# Patient Record
Sex: Female | Born: 1970
Health system: Southern US, Community
[De-identification: ages and names within clinical notes are randomized; demographics above are authoritative.]

## PROBLEM LIST (undated history)

## (undated) DIAGNOSIS — R42 Dizziness and giddiness: Secondary | ICD-10-CM

## (undated) DIAGNOSIS — D649 Anemia, unspecified: Secondary | ICD-10-CM

## (undated) HISTORY — PX: DILATION AND CURETTAGE OF UTERUS: SHX78

## (undated) HISTORY — DX: Anemia, unspecified: D64.9

---

## 2013-02-03 ENCOUNTER — Encounter (HOSPITAL_COMMUNITY): Payer: Self-pay

## 2013-02-03 ENCOUNTER — Emergency Department (HOSPITAL_COMMUNITY)
Admission: EM | Admit: 2013-02-03 | Discharge: 2013-02-04 | Disposition: A | Discharge status: Home / Self Care | Payer: Medicaid Other | Attending: Emergency Medicine | Admitting: Emergency Medicine

## 2013-02-03 ENCOUNTER — Emergency Department (HOSPITAL_COMMUNITY): Payer: Medicaid Other

## 2013-02-03 DIAGNOSIS — H53149 Visual discomfort, unspecified: Secondary | ICD-10-CM | POA: Insufficient documentation

## 2013-02-03 DIAGNOSIS — R42 Dizziness and giddiness: Secondary | ICD-10-CM | POA: Insufficient documentation

## 2013-02-03 DIAGNOSIS — G43909 Migraine, unspecified, not intractable, without status migrainosus: Secondary | ICD-10-CM | POA: Insufficient documentation

## 2013-02-03 DIAGNOSIS — R110 Nausea: Secondary | ICD-10-CM | POA: Insufficient documentation

## 2013-02-03 HISTORY — DX: Dizziness and giddiness: R42

## 2013-02-03 LAB — CBC
HCT: 37.4 % (ref 36.0–46.0)
Hemoglobin: 12.5 g/dL (ref 12.0–15.0)
MCH: 28.2 pg (ref 26.0–34.0)
MCHC: 33.4 g/dL (ref 30.0–36.0)
MCV: 84.4 fL (ref 78.0–100.0)
Platelets: 269 10*3/uL (ref 150–400)
RBC: 4.43 MIL/uL (ref 3.87–5.11)
RDW: 14 % (ref 11.5–15.5)
WBC: 5.9 10*3/uL (ref 4.0–10.5)

## 2013-02-03 LAB — CBC WITH DIFFERENTIAL/PLATELET
Basophils Absolute: 0 10*3/uL (ref 0.0–0.1)
Basophils Relative: 0 % (ref 0–1)
Eosinophils Absolute: 0.1 10*3/uL (ref 0.0–0.7)
Eosinophils Relative: 2 % (ref 0–5)
HCT: 35.3 % — ABNORMAL LOW (ref 36.0–46.0)
Hemoglobin: 11.8 g/dL — ABNORMAL LOW (ref 12.0–15.0)
Lymphocytes Relative: 39 % (ref 12–46)
Lymphs Abs: 2.2 10*3/uL (ref 0.7–4.0)
MCH: 28.2 pg (ref 26.0–34.0)
MCHC: 33.4 g/dL (ref 30.0–36.0)
MCV: 84.2 fL (ref 78.0–100.0)
Monocytes Absolute: 0.7 10*3/uL (ref 0.1–1.0)
Monocytes Relative: 12 % (ref 3–12)
Neutro Abs: 2.5 10*3/uL (ref 1.7–7.7)
Neutrophils Relative %: 46 % (ref 43–77)
Platelets: 257 10*3/uL (ref 150–400)
RBC: 4.19 MIL/uL (ref 3.87–5.11)
RDW: 14 % (ref 11.5–15.5)
WBC: 5.5 10*3/uL (ref 4.0–10.5)

## 2013-02-03 LAB — BASIC METABOLIC PANEL
BUN: 10 mg/dL (ref 6–23)
CO2: 29 mEq/L (ref 19–32)
Calcium: 9.2 mg/dL (ref 8.4–10.5)
Chloride: 104 mEq/L (ref 96–112)
Creatinine, Ser: 0.75 mg/dL (ref 0.50–1.10)
GFR calc Af Amer: >90 mL/min (ref >90)
GFR calc non Af Amer: >90 mL/min (ref >90)
Glucose, Bld: 77 mg/dL (ref 70–99)
Potassium: 3.8 mEq/L (ref 3.5–5.1)
Sodium: 139 mEq/L (ref 135–145)

## 2013-02-03 LAB — POCT I-STAT TROPONIN I: Troponin i, poc: 0 ng/mL (ref 0.00–0.08)

## 2013-02-03 LAB — COMPREHENSIVE METABOLIC PANEL
ALT: 15 U/L (ref 0–35)
AST: 19 U/L (ref 0–37)
Albumin: 3.6 g/dL (ref 3.5–5.2)
Alkaline Phosphatase: 63 U/L (ref 39–117)
BUN: 8 mg/dL (ref 6–23)
CO2: 31 mEq/L (ref 19–32)
Calcium: 9.5 mg/dL (ref 8.4–10.5)
Chloride: 101 mEq/L (ref 96–112)
Creatinine, Ser: 0.6 mg/dL (ref 0.50–1.10)
GFR calc Af Amer: >90 mL/min (ref >90)
GFR calc non Af Amer: >90 mL/min (ref >90)
Glucose, Bld: 80 mg/dL (ref 70–99)
Potassium: 3.4 mEq/L — ABNORMAL LOW (ref 3.5–5.1)
Sodium: 139 mEq/L (ref 135–145)
Total Bilirubin: 0.3 mg/dL (ref 0.3–1.2)
Total Protein: 7.1 g/dL (ref 6.0–8.3)

## 2013-02-03 MED ORDER — DIPHENHYDRAMINE HCL 50 MG/ML IJ SOLN
25.0000 mg | Freq: Once | INTRAMUSCULAR | Status: AC
  Administered 2013-02-03: 25 mg via INTRAVENOUS
  Filled 2013-02-03: qty 1

## 2013-02-03 MED ORDER — PROCHLORPERAZINE EDISYLATE 5 MG/ML IJ SOLN
10.0000 mg | Freq: Once | INTRAMUSCULAR | Status: AC
  Administered 2013-02-03: 10 mg via INTRAVENOUS
  Filled 2013-02-03: qty 2

## 2013-02-03 MED ORDER — SODIUM CHLORIDE 0.9 % IV BOLUS (SEPSIS)
1000.0000 mL | Freq: Once | INTRAVENOUS | Status: AC
  Administered 2013-02-03: 1000 mL via INTRAVENOUS

## 2013-02-03 MED ORDER — DEXAMETHASONE SODIUM PHOSPHATE 10 MG/ML IJ SOLN
10.0000 mg | Freq: Once | INTRAMUSCULAR | Status: AC
  Administered 2013-02-03: 10 mg via INTRAVENOUS
  Filled 2013-02-03: qty 1

## 2013-02-03 NOTE — ED Notes (Signed)
Pt back from MRI 

## 2013-02-03 NOTE — ED Notes (Addendum)
Pt. Has had dizziness for 17 years and in the last few months the dizziness is worse and she also has been feeling weak in the last few months.  Pt. Is alert and oriented X4.  Speech is clear/  She denies any n/v/d.  Pt. Is treated for dizziness. No neuro deficits noted. Pt. Denies any chest pain

## 2013-02-03 NOTE — ED Notes (Signed)
Family at bedside. 

## 2013-02-04 NOTE — ED Provider Notes (Signed)
History     CSN: [id number]  Arrival date & time 02/03/13  1843   First MD Initiated Contact with Patient 02/03/13 2002      Chief Complaint  Patient presents with  . Dizziness    HPI 42 year old female with a history of headaches and vertigo comes in complaining of severe headache and dizziness.  Approximately 3 days ago patient had a mild headache that has gradually worsened over the course the past 3 days and is now severe, 10 out of 10. The pain isn't constant. She has had associated dizziness. She describes the dizziness as a sensation of the ribs pending. Her headache is located frontally and around her eyes and is associated with photophobia, phonophobia, nausea, and she said 2 episodes of nonbloody nonbilious emesis. She's had no fever, no neck pain, no chest pain, shortness of breath, no diarrhea, no abdominal pain, no dysuria, no urinary pregnancy, or other significant symptoms.  She reports that she has had similar headaches many times in the past although she's never been diagnosed with migraines. She normally takes acetaminophen for headaches. Her headaches are sometimes received relieved by acetaminophen and other times not. She states that she has been diagnosed with "dizziness" before. She states that her dizziness was similar in the past to which is experiencing now but not as severe. This presently she experienced many times over the years. She's had it associated with her headaches on occasion. She states that her primary care physician has given her a medicine for her dizziness before. Can't recall the medicine is. She states that her dizziness is made worse by laying flat. It is relieved by nothing. It is severe. It makes her nausea worse. She's not had any difficulty with walking or with balance.   Past Medical History  Diagnosis Date  . Vertigo     History reviewed. No pertinent past surgical history.  No family history on file.  History  Substance Use Topics  .  Smoking status: Never Smoker   . Smokeless tobacco: Not on file  . Alcohol Use: No    OB History   Grav Para Term Preterm Abortions TAB SAB Ect Mult Living                  Review of Systems  Constitutional: Negative for fever, chills and diaphoresis.  HENT: Negative for congestion, rhinorrhea, neck pain and neck stiffness.   Respiratory: Negative for cough, shortness of breath and wheezing.   Cardiovascular: Negative for chest pain and leg swelling.  Gastrointestinal: Negative for nausea, vomiting, abdominal pain and diarrhea.  Genitourinary: Negative for dysuria, urgency, frequency, flank pain, vaginal bleeding, vaginal discharge and difficulty urinating.  Skin: Negative for rash.  Neurological: Positive for dizziness and headaches. Negative for tremors, seizures, syncope, facial asymmetry, speech difficulty, weakness and numbness.  All other systems reviewed and are negative.    Allergies  Review of patient's allergies indicates no known allergies.  Home Medications   Current Outpatient Rx  Name  Route  Sig  Dispense  Refill  . OVER THE COUNTER MEDICATION   Oral   Take 2 tablets by mouth once. Headache medicine           BP 105/76  Pulse 68  Temp(Src) 98.2 F (36.8 C) (Oral)  Resp 16  SpO2 100%  LMP 01/06/2013  Physical Exam  Nursing note and vitals reviewed. Constitutional: She is oriented to person, place, and time. She appears well-developed and well-nourished. No distress.  HENT:  Head: Normocephalic and atraumatic.  Mouth/Throat: Oropharynx is clear and moist.  Eyes: Conjunctivae and EOM are normal. Pupils are equal, round, and reactive to light. No scleral icterus.  Neck: Normal range of motion. Neck supple. No JVD present.  Cardiovascular: Normal rate, regular rhythm, normal heart sounds and intact distal pulses.  Exam reveals no gallop and no friction rub.   No murmur heard. Pulmonary/Chest: Effort normal and breath sounds normal. No respiratory  distress. She has no wheezes. She has no rales.  Abdominal: Soft. Bowel sounds are normal. She exhibits no distension. There is no tenderness. There is no rebound and no guarding.  Musculoskeletal: She exhibits no edema.  Neurological: She is alert and oriented to person, place, and time. She displays normal reflexes. No cranial nerve deficit. She exhibits normal muscle tone. Coordination normal. GCS eye subscore is 4. GCS verbal subscore is 5. GCS motor subscore is 6.  5/5 strength in bilateral upper and lower extremities.  Normal sensation to light touch throughout.  Visual fields intact to confrontation.  Normal heel to shin and finger to nose testing.  Normal gait.  Negative Romberg.  Skin: Skin is warm and dry. She is not diaphoretic.    ED Course  Procedures (including critical care time)  Labs Reviewed  COMPREHENSIVE METABOLIC PANEL - Abnormal; Notable for the following:    Potassium 3.4 (*)    All other components within normal limits  CBC WITH DIFFERENTIAL - Abnormal; Notable for the following:    Hemoglobin 11.8 (*)    HCT 35.3 (*)    All other components within normal limits  CBC  BASIC METABOLIC PANEL  POCT I-STAT TROPONIN I   No results found.   1. Migraine   2. Dizziness       MDM  42 year old female with a history of vertigo and headaches presents complaining of the same. Sig on for about 3 days. Her headache is at 10 but has been gradual in onset over the course of the last 3 days has slowly worsened. Her vertigo is similar to prior symptoms.  She has vitals that are within normal limits. Head is atraumatic she has supple neck with no meningismus, negative Kernig, negative Brudzinski. Her neurological exam is nonfocal. This includes normal gait and normal finger-nose-finger, normal heel to shin, visual fields intact to confrontation. She has no carotid bruit, exam is otherwise unremarkable.  Her headache is gradual in onset and seems migrainous in nature. However  due to the persistence of her vertigo obtained a MRI of her brain which is negative for any acute stroke, mass lesions, or any other acute abnormality. She is treating her support with a migraine cocktail and her symptoms completely resolved. Feels she is safe for discharge with follow up with her primary care physician this week to discuss her migraines. She is given return precautions. An Arabic interpreter was used for history and discharge instructions.        Toney Sang, MD 02/04/13 [phone]

## 2013-02-07 NOTE — ED Provider Notes (Signed)
I reviewed the resident's note and I agree with the findings and plan.   Celene Kras, MD 02/07/13 [phone]

## 2013-03-10 ENCOUNTER — Other Ambulatory Visit: Payer: Self-pay | Admitting: Family Medicine

## 2013-03-10 DIAGNOSIS — M549 Dorsalgia, unspecified: Secondary | ICD-10-CM

## 2013-03-10 DIAGNOSIS — R319 Hematuria, unspecified: Secondary | ICD-10-CM

## 2013-03-11 ENCOUNTER — Ambulatory Visit
Admission: RE | Admit: 2013-03-11 | Discharge: 2013-03-11 | Disposition: A | Discharge status: Home / Self Care | Payer: Medicaid Other | Source: Ambulatory Visit | Attending: Family Medicine | Admitting: Family Medicine

## 2013-03-11 DIAGNOSIS — M549 Dorsalgia, unspecified: Secondary | ICD-10-CM

## 2013-03-11 DIAGNOSIS — R319 Hematuria, unspecified: Secondary | ICD-10-CM

## 2013-03-22 ENCOUNTER — Ambulatory Visit: Payer: Medicaid Other | Admitting: Physical Therapy

## 2013-03-22 ENCOUNTER — Ambulatory Visit: Payer: Medicaid Other | Attending: Orthopedic Surgery | Admitting: Physical Therapy

## 2013-03-22 DIAGNOSIS — IMO0001 Reserved for inherently not codable concepts without codable children: Secondary | ICD-10-CM | POA: Insufficient documentation

## 2013-03-22 DIAGNOSIS — M545 Low back pain, unspecified: Secondary | ICD-10-CM | POA: Insufficient documentation

## 2013-09-05 ENCOUNTER — Encounter (INDEPENDENT_AMBULATORY_CARE_PROVIDER_SITE_OTHER): Payer: Self-pay

## 2013-09-05 ENCOUNTER — Emergency Department (INDEPENDENT_AMBULATORY_CARE_PROVIDER_SITE_OTHER)
Admission: EM | Admit: 2013-09-05 | Discharge: 2013-09-05 | Disposition: A | Discharge status: Home / Self Care | Payer: Medicaid Other | Source: Home / Self Care | Attending: Emergency Medicine | Admitting: Emergency Medicine

## 2013-09-05 ENCOUNTER — Encounter (HOSPITAL_COMMUNITY): Payer: Self-pay | Admitting: Emergency Medicine

## 2013-09-05 ENCOUNTER — Encounter (INDEPENDENT_AMBULATORY_CARE_PROVIDER_SITE_OTHER): Payer: Self-pay | Admitting: Surgery

## 2013-09-05 ENCOUNTER — Ambulatory Visit (INDEPENDENT_AMBULATORY_CARE_PROVIDER_SITE_OTHER): Payer: Medicaid Other | Admitting: Surgery

## 2013-09-05 VITALS — BP 124/60 | HR 88 | Temp 98.4°F | Resp 14 | Ht 61.0 in | Wt 163.0 lb

## 2013-09-05 DIAGNOSIS — D179 Benign lipomatous neoplasm, unspecified: Secondary | ICD-10-CM

## 2013-09-05 DIAGNOSIS — A088 Other specified intestinal infections: Secondary | ICD-10-CM

## 2013-09-05 DIAGNOSIS — A084 Viral intestinal infection, unspecified: Secondary | ICD-10-CM

## 2013-09-05 LAB — POCT I-STAT, CHEM 8
BUN: 7 mg/dL (ref 6–23)
Calcium, Ion: 1.24 mmol/L — ABNORMAL HIGH (ref 1.12–1.23)
Chloride: 100 mEq/L (ref 96–112)
Creatinine, Ser: 0.6 mg/dL (ref 0.50–1.10)
Glucose, Bld: 84 mg/dL (ref 70–99)
HCT: 46 % (ref 36.0–46.0)
Hemoglobin: 15.6 g/dL — ABNORMAL HIGH (ref 12.0–15.0)
Potassium: 3.8 mEq/L (ref 3.5–5.1)
Sodium: 139 mEq/L (ref 135–145)
TCO2: 26 mmol/L (ref 0–100)

## 2013-09-05 MED ORDER — ONDANSETRON HCL 4 MG/2ML IJ SOLN
INTRAMUSCULAR | Status: AC
Start: 2013-09-05 — End: 2013-09-05
  Filled 2013-09-05: qty 2

## 2013-09-05 MED ORDER — DIPHENOXYLATE-ATROPINE 2.5-0.025 MG PO TABS: 1.0000 | ORAL_TABLET | Freq: Four times a day (QID) | ORAL | Status: DC | PRN

## 2013-09-05 MED ORDER — ONDANSETRON HCL 4 MG/2ML IJ SOLN
4.0000 mg | Freq: Once | INTRAMUSCULAR | Status: AC
  Administered 2013-09-05: 4 mg via INTRAVENOUS

## 2013-09-05 MED ORDER — ONDANSETRON HCL 8 MG PO TABS: 8.0000 mg | ORAL_TABLET | Freq: Three times a day (TID) | ORAL | Status: DC | PRN

## 2013-09-05 MED ORDER — SODIUM CHLORIDE 0.9 % IV SOLN
INTRAVENOUS | Status: DC
  Administered 2013-09-05: 15:00:00 via INTRAVENOUS

## 2013-09-05 NOTE — ED Notes (Signed)
Pt  Reports   Symptoms  Of  Nausea   Vomiting  Diarrhea         With  dizzyness     X  4  Days          Pt  Last  Vomited  Yesterday        History  Of  Vertigo     Pt  Also  Reports  Symptoms      decresed  Appetite

## 2013-09-05 NOTE — Discharge Instructions (Signed)

## 2013-09-05 NOTE — ED Provider Notes (Signed)
Chief Complaint:   Chief Complaint  Patient presents with  . Nausea    History of Present Illness:   Carrie Shelton is a 42 year old female who presents with a four-day history of nausea, vomiting, diarrhea, dizziness, and inability to eat any solid foods. She has not had any fever or chills. There's been no blood in the vomitus or the stool the diarrhea has gotten better, but she still vomiting up almost all by mouth intake. No recent foreign travel, sick exposures, or suspicious ingestions. She denies any URI symptoms. She speaks limited [place]. She is with her daughter social work Tax inspector. They served as Nurse, learning disability for her.  Review of Systems:  Other than noted above, the patient denies any of the following symptoms: Systemic:  No fevers, chills, sweats, weight loss or gain, fatigue, or tiredness. ENT:  No nasal congestion, rhinorrhea, or sore throat. Lungs:  No cough, wheezing, or shortness of breath. Cardiac:  No chest pain, syncope, or presyncope. GI:  No abdominal pain, nausea, vomiting, anorexia, diarrhea, constipation, blood in stool or vomitus. GU:  No dysuria, frequency, or urgency.  PMFSH:  Past medical history, family history, social history, meds, and allergies were reviewed. She has a long-standing history of weakness and vertigo. She's been to the emergency room for the vertigo. She has seen her primary care physician for this as well. She's on Antivert.  Physical Exam:   Vital signs:  BP 100/70  Pulse 96  Temp(Src) 97.9 F (36.6 C) (Oral)  Resp 16  SpO2 100%  LMP 08/24/2013 Filed Vitals:   09/05/13 1357 Baseline  09/05/13 1446 Supine  09/05/13 1448 Sitting  09/05/13 1450 Standing   BP: 95/71 98/68 104/60 100/70  Pulse: 78 75 74 96  Temp: 97.9 F (36.6 C)     TempSrc: Oral     Resp: 16     SpO2: 100%      General:  Alert and oriented.  In no distress.  Skin warm and dry.  Good skin turgor, brisk capillary refill. ENT:  No scleral icterus, moist mucous  membranes, no oral lesions, pharynx clear. Lungs:  Breath sounds clear and equal bilaterally.  No wheezes, rales, or rhonchi. Heart:  Rhythm regular, without extrasystoles.  No gallops or murmers. Abdomen:  Soft, flat, nondistended. No organomegaly or mass. Bowel sounds are normally active. No tenderness, guarding, or rebound. Skin: Clear, warm, and dry.  Good turgor.  Brisk capillary refill.  Labs:   Results for orders placed during the hospital encounter of 09/05/13  POCT I-STAT, CHEM 8      Result Value Range   Sodium 139  135 - 145 mEq/L   Potassium 3.8  3.5 - 5.1 mEq/L   Chloride 100  96 - 112 mEq/L   BUN 7  6 - 23 mg/dL   Creatinine, Ser 9.56  0.50 - 1.10 mg/dL   Glucose, Bld 84  70 - 99 mg/dL   Calcium, Ion 2.13 (*) 1.12 - 1.23 mmol/L   TCO2 26  0 - 100 mmol/L   Hemoglobin 15.6 (*) 12.0 - 15.0 g/dL   HCT 08.6  57.8 - 46.9 %     Course in Urgent Care Center:   She was given 1 L of normal saline over one hour and Zofran 4 mg IV. She felt better thereafter and did not have any further nausea or diarrhea while at the Urgent Care Center.  Assessment:  The encounter diagnosis was Viral gastroenteritis.  With mild dehydration.  Plan:  1.  Meds:  The following meds were prescribed:   Discharge Medication List as of 09/05/2013  3:59 PM    START taking these medications   Details  diphenoxylate-atropine (LOMOTIL) 2.5-0.025 MG per tablet Take 1 tablet by mouth 4 (four) times daily as needed for diarrhea or loose stools., Starting 09/05/2013, Until Discontinued, Print    ondansetron (ZOFRAN) 8 MG tablet Take 1 tablet (8 mg total) by mouth every 8 (eight) hours as needed for nausea or vomiting., Starting 09/05/2013, Until Discontinued, Normal        2.  Patient Education/Counseling:  The patient was given appropriate handouts, self care instructions, and instructed in symptomatic relief. The patient was told to stay on clear liquids for the remainder of the day, then advance to a  B.R.A.T. diet starting tomorrow.  3.  Follow up:  The patient was told to follow up if no better in 3 to 4 days, if becoming worse in any way, and given some red flag symptoms such as persistent vomiting or diarrhea or any evidence of GI bleeding which would prompt immediate return.  Follow up here as necessary.       Reuben Likes, MD 09/05/13 [phone]

## 2013-09-05 NOTE — Progress Notes (Signed)
Patient ID: Carrie Shelton, female   DOB: July 27, 1971, 42 y.o.   MRN: [id number]  Chief Complaint  Patient presents with  . New Evaluation    eval lower spine area lipoma    HPI Carrie Shelton is a 41 y.o. female.  Patient sent at the request of Dr Mayford Knife for a mass over the patient's lower back. She is accompanied by family who can translate since she speaks little [place]. She is also accompanied by a friend. The mass has been present for a number of years. She has a multitude of complaints ranging from a vertigo to low back pain. She underwent CT scanning in May of 2014 for abdominal pain which I reviewed which is unremarkable. The mass is been present for a number of years as far as I can tell talking with patient's family. There is no drainage. There is no redness. He is causing mild to moderate discomfort when she lies or sits. She does complain of low back pain. HPI  Past Medical History  Diagnosis Date  . Vertigo   . Anemia     Past Surgical History  Procedure Laterality Date  . Dilation and curettage of uterus      daughter is not really sure this is the only word she could remember    History reviewed. No pertinent family history.  Social History History  Substance Use Topics  . Smoking status: Never Smoker   . Smokeless tobacco: Never Used  . Alcohol Use: No    No Known Allergies    Review of Systems Review of Systems  Constitutional: Positive for fatigue.  HENT: Positive for postnasal drip.   Eyes: Negative.   Respiratory: Negative.   Gastrointestinal: Positive for nausea and vomiting.  Genitourinary: Negative.   Musculoskeletal: Positive for arthralgias and back pain.  Neurological: Positive for dizziness.  Hematological: Negative.   Psychiatric/Behavioral: Negative.     Blood pressure 124/60, pulse 88, temperature 98.4 F (36.9 C), temperature source Temporal, resp. rate 14, height 5\' 1"  (1.549 m), weight 163 lb (73.936 kg).  Physical Exam Physical  Exam  Constitutional: She is oriented to person, place, and time. She appears well-developed and well-nourished.  HENT:  Head: Normocephalic and atraumatic.  Eyes: Pupils are equal, round, and reactive to light. No scleral icterus.  Neck: Normal range of motion. Neck supple.  Musculoskeletal: Normal range of motion.  Neurological: She is alert and oriented to person, place, and time.  Skin:     Psychiatric: She has a normal mood and affect. Her behavior is normal. Judgment and thought content normal.    Data Reviewed CT abdomen pelvis May 2014  Assessment    Lipoma over the coccyx 5 cm    Plan    I explained is not causing the majority of her symptoms. He can be removed. This will not make her back pain or other symptoms any better. They will followup in 6 months for a recheck or sooner or she begins to have more discomfort sitting or lying.       [name] 09/05/2013, 12:00 PM

## 2013-09-05 NOTE — Patient Instructions (Signed)

## 2013-11-11 ENCOUNTER — Emergency Department (HOSPITAL_COMMUNITY)
Admission: EM | Admit: 2013-11-11 | Discharge: 2013-11-11 | Disposition: A | Discharge status: Home / Self Care | Payer: Medicaid Other | Source: Home / Self Care | Attending: Family Medicine | Admitting: Family Medicine

## 2013-11-11 ENCOUNTER — Encounter (HOSPITAL_COMMUNITY): Payer: Self-pay | Admitting: Emergency Medicine

## 2013-11-11 DIAGNOSIS — R5381 Other malaise: Secondary | ICD-10-CM

## 2013-11-11 DIAGNOSIS — R5383 Other fatigue: Secondary | ICD-10-CM

## 2013-11-11 NOTE — ED Provider Notes (Signed)
CSN: [id number]     Arrival date & time 11/11/13  1157 History   First MD Initiated Contact with Patient 11/11/13 1320     Chief Complaint  Patient presents with  . Dizziness     (Consider location/radiation/quality/duration/timing/severity/associated sxs/prior Treatment) HPI Comments: 43 year old female presents complaining of fatigue. She says she has had fatigue, dizziness, constantly all day every day for 2 years. These symptoms have not worsened at all. She describes this feeling as feeling like "my legs are heavy." She went to her primary care doctor about this today, they were too busy to see her, they told her if she was really very tired then she should go to the emergency room. She denies any specific areas of pain. She has seen her primary care doctor about this before and believes it may be related to menopause. She definitely declines any worsening in her condition. Denies any other symptoms at this time   Past Medical History  Diagnosis Date  . Vertigo   . Anemia    Past Surgical History  Procedure Laterality Date  . Dilation and curettage of uterus      daughter is not really sure this is the only word she could remember   History reviewed. No pertinent family history. History  Substance Use Topics  . Smoking status: Never Smoker   . Smokeless tobacco: Never Used  . Alcohol Use: No   OB History   Grav Para Term Preterm Abortions TAB SAB Ect Mult Living                 Review of Systems  Constitutional: Positive for fatigue. Negative for fever and chills.  Eyes: Negative for visual disturbance.  Respiratory: Negative for cough and shortness of breath.   Cardiovascular: Negative for chest pain, palpitations and leg swelling.  Gastrointestinal: Negative for nausea, vomiting and abdominal pain.  Endocrine: Negative for polydipsia and polyuria.  Genitourinary: Negative for dysuria, urgency and frequency.  Musculoskeletal: Negative for arthralgias and myalgias.   Skin: Negative for rash.  Neurological: Positive for dizziness. Negative for weakness and light-headedness.      Allergies  Review of patient's allergies indicates no known allergies.  Home Medications   Current Outpatient Rx  Name  Route  Sig  Dispense  Refill  . diphenoxylate-atropine (LOMOTIL) 2.5-0.025 MG per tablet   Oral   Take 1 tablet by mouth 4 (four) times daily as needed for diarrhea or loose stools.   16 tablet   0   . ondansetron (ZOFRAN) 8 MG tablet   Oral   Take 1 tablet (8 mg total) by mouth every 8 (eight) hours as needed for nausea or vomiting.   12 tablet   1   . OVER THE COUNTER MEDICATION   Oral   Take 2 tablets by mouth once. Headache medicine         . UNABLE TO FIND      Med Name:           BP 100/63  Pulse 89  Temp(Src) 98.3 F (36.8 C) (Oral)  Resp 18  SpO2 100% Physical Exam  Nursing note and vitals reviewed. Constitutional: She is oriented to person, place, and time. Vital signs are normal. She appears well-developed and well-nourished. No distress.  HENT:  Head: Normocephalic and atraumatic.  Eyes: Conjunctivae are normal.  Neck: Normal range of motion. Neck supple. No thyromegaly present.  Cardiovascular: Normal rate, regular rhythm and normal heart sounds.  Exam reveals no gallop and  no friction rub.   No murmur heard. Pulmonary/Chest: Effort normal. No respiratory distress.  Neurological: She is alert and oriented to person, place, and time. She has normal strength. Coordination normal.  Skin: Skin is warm and dry. No rash noted. She is not diaphoretic.  Psychiatric: She has a normal mood and affect. Judgment normal.    ED Course  Procedures (including critical care time) Labs Review Labs Reviewed - No data to display Imaging Review No results found.    MDM   Final diagnoses:  Fatigue    The differential for this includes thyroid dysfunction, anemia, depression. Physical exam is normal the vitals are normal  today. She is not orthostatic. She is to followup with her primary care physician for evaluation and treatment.  Liam Graham, PA-C 11/12/13 1705

## 2013-11-11 NOTE — Discharge Instructions (Signed)
This is a chronic medical problem. You need to followup with your primary care to figure out what is going on. Your medical condition does not appear to be dangerous at this time, please followup with your primary care physician as soon as possible.   Fatigue Fatigue is a feeling of tiredness, lack of energy, lack of motivation, or feeling tired all the time. Having enough rest, good nutrition, and reducing stress will normally reduce fatigue. Consult your caregiver if it persists. The nature of your fatigue will help your caregiver to find out its cause. The treatment is based on the cause.  CAUSES  There are many causes for fatigue. Most of the time, fatigue can be traced to one or more of your habits or routines. Most causes fit into one or more of three general areas. They are: Lifestyle problems  Sleep disturbances.  Overwork.  Physical exertion.  Unhealthy habits.  Poor eating habits or eating disorders.  Alcohol and/or drug use .  Lack of proper nutrition (malnutrition). Psychological problems  Stress and/or anxiety problems.  Depression.  Grief.  Boredom. Medical Problems or Conditions  Anemia.  Pregnancy.  Thyroid gland problems.  Recovery from major surgery.  Continuous pain.  Emphysema or asthma that is not well controlled  Allergic conditions.  Diabetes.  Infections (such as mononucleosis).  Obesity.  Sleep disorders, such as sleep apnea.  Heart failure or other heart-related problems.  Cancer.  Kidney disease.  Liver disease.  Effects of certain medicines such as antihistamines, cough and cold remedies, prescription pain medicines, heart and blood pressure medicines, drugs used for treatment of cancer, and some antidepressants. SYMPTOMS  The symptoms of fatigue include:   Lack of energy.  Lack of drive (motivation).  Drowsiness.  Feeling of indifference to the surroundings. DIAGNOSIS  The details of how you feel help guide your  caregiver in finding out what is causing the fatigue. You will be asked about your present and past health condition. It is important to review all medicines that you take, including prescription and non-prescription items. A thorough exam will be done. You will be questioned about your feelings, habits, and normal lifestyle. Your caregiver may suggest blood tests, urine tests, or other tests to look for common medical causes of fatigue.  TREATMENT  Fatigue is treated by correcting the underlying cause. For example, if you have continuous pain or depression, treating these causes will improve how you feel. Similarly, adjusting the dose of certain medicines will help in reducing fatigue.  HOME CARE INSTRUCTIONS   Try to get the required amount of good sleep every night.  Eat a healthy and nutritious diet, and drink enough water throughout the day.  Practice ways of relaxing (including yoga or meditation).  Exercise regularly.  Make plans to change situations that cause stress. Act on those plans so that stresses decrease over time. Keep your work and personal routine reasonable.  Avoid street drugs and minimize use of alcohol.  Start taking a daily multivitamin after consulting your caregiver. SEEK MEDICAL CARE IF:   You have persistent tiredness, which cannot be accounted for.  You have fever.  You have unintentional weight loss.  You have headaches.  You have disturbed sleep throughout the night.  You are feeling sad.  You have constipation.  You have dry skin.  You have gained weight.  You are taking any new or different medicines that you suspect are causing fatigue.  You are unable to sleep at night.  You develop any  unusual swelling of your legs or other parts of your body. SEEK IMMEDIATE MEDICAL CARE IF:   You are feeling confused.  Your vision is blurred.  You feel faint or pass out.  You develop severe headache.  You develop severe abdominal, pelvic, or  back pain.  You develop chest pain, shortness of breath, or an irregular or fast heartbeat.  You are unable to pass a normal amount of urine.  You develop abnormal bleeding such as bleeding from the rectum or you vomit blood.  You have thoughts about harming yourself or committing suicide.  You are worried that you might harm someone else. MAKE SURE YOU:   Understand these instructions.  Will watch your condition.  Will get help right away if you are not doing well or get worse. Document Released: 07/06/2007 Document Revised: 12/01/2011 Document Reviewed: 07/06/2007 [facility] Patient Information 2014 [place].

## 2013-11-11 NOTE — ED Notes (Signed)
Pt triaged and assessed by provider.   Provider in before nurse. 

## 2013-11-21 NOTE — ED Provider Notes (Signed)
Medical screening examination/treatment/procedure(s) were performed by resident physician or non-physician practitioner and as supervising physician I was immediately available for consultation/collaboration.   Pauline Good MD.   Billy Fischer, MD 11/21/13 1520

## 2014-02-11 ENCOUNTER — Emergency Department (HOSPITAL_COMMUNITY)
Admission: EM | Admit: 2014-02-11 | Discharge: 2014-02-11 | Disposition: A | Discharge status: Home / Self Care | Payer: Medicaid Other | Source: Home / Self Care | Attending: Family Medicine | Admitting: Family Medicine

## 2014-02-11 ENCOUNTER — Encounter (HOSPITAL_COMMUNITY): Payer: Self-pay | Admitting: Emergency Medicine

## 2014-02-11 DIAGNOSIS — IMO0002 Reserved for concepts with insufficient information to code with codable children: Secondary | ICD-10-CM

## 2014-02-11 DIAGNOSIS — X58XXXA Exposure to other specified factors, initial encounter: Secondary | ICD-10-CM

## 2014-02-11 DIAGNOSIS — S46911A Strain of unspecified muscle, fascia and tendon at shoulder and upper arm level, right arm, initial encounter: Secondary | ICD-10-CM

## 2014-02-11 MED ORDER — IBUPROFEN 800 MG PO TABS
800.0000 mg | ORAL_TABLET | Freq: Once | ORAL | Status: AC
  Administered 2014-02-11: 800 mg via ORAL

## 2014-02-11 MED ORDER — IBUPROFEN 800 MG PO TABS
ORAL_TABLET | ORAL | Status: AC
  Filled 2014-02-11: qty 1

## 2014-02-11 MED ORDER — IBUPROFEN 600 MG PO TABS: 600.0000 mg | ORAL_TABLET | Freq: Four times a day (QID) | ORAL | Status: DC | PRN

## 2014-02-11 NOTE — ED Provider Notes (Signed)
Medical screening examination/treatment/procedure(s) were performed by resident physician or non-physician practitioner and as supervising physician I was immediately available for consultation/collaboration.   Pauline Good MD.   Billy Fischer, MD 02/11/14 1130

## 2014-02-11 NOTE — ED Provider Notes (Signed)
CSN: [id number]     Arrival date & time 02/11/14  2376 History   First MD Initiated Contact with Patient 02/11/14 0913     Chief Complaint  Patient presents with  . Shoulder Pain   (Consider location/radiation/quality/duration/timing/severity/associated sxs/prior Treatment) HPI Comments: Left hand dominant No reported injury Works 6 days a week in [employer]. 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  No previous surgery No loss of strength or sensation PCP: Dr. York Ram Pain radiates to scapula, right ear and right clavicle.   Patient is a 43 y.o. female presenting with shoulder pain. The history is provided by the patient.  Shoulder Pain This is a new problem. Episode onset: began 2 weeks ago. The problem occurs daily (waxes and wanes). The problem has not changed since onset.Pertinent negatives include no chest pain, no abdominal pain and no shortness of breath. Exacerbated by: overhead reach. Nothing relieves the symptoms. She has tried acetaminophen for the symptoms. The treatment provided no relief.    Past Medical History  Diagnosis Date  . Vertigo   . Anemia    Past Surgical History  Procedure Laterality Date  . Dilation and curettage of uterus      daughter is not really sure this is the only word she could remember   No family history on file. History  Substance Use Topics  . Smoking status: Never Smoker   . Smokeless tobacco: Never Used  . Alcohol Use: No   OB History   Grav Para Term Preterm Abortions TAB SAB Ect Mult Living                 Review of Systems  Respiratory: Negative for shortness of breath.   Cardiovascular: Negative for chest pain.  Gastrointestinal: Negative for abdominal pain.  All other systems reviewed and are negative.   Allergies  Review of patient's allergies indicates no known allergies.  Home Medications   Prior to Admission medications   Medication Sig Start Date End Date Taking? Authorizing Provider   diphenoxylate-atropine (LOMOTIL) 2.5-0.025 MG per tablet Take 1 tablet by mouth 4 (four) times daily as needed for diarrhea or loose stools. 09/05/13   Harden Mo, MD  ondansetron (ZOFRAN) 8 MG tablet Take 1 tablet (8 mg total) by mouth every 8 (eight) hours as needed for nausea or vomiting. 09/05/13   Harden Mo, MD  OVER THE COUNTER MEDICATION Take 2 tablets by mouth once. Headache medicine    Historical Provider, MD  [place] Name:     Historical Provider, MD   BP 103/70  Pulse 77  Temp(Src) 98.4 F (36.9 C) (Oral)  SpO2 99%  LMP 02/11/2014 Physical Exam  Nursing note and vitals reviewed. Constitutional: She is oriented to person, place, and time. She appears well-developed and well-nourished. No distress.  HENT:  Head: Normocephalic and atraumatic.  Right Ear: Hearing, tympanic membrane, external ear and ear canal normal.  Left Ear: Hearing, tympanic membrane, external ear and ear canal normal.  Nose: Nose normal.  Eyes: Conjunctivae are normal. No scleral icterus.  Neck: Trachea normal, normal range of motion, full passive range of motion without pain and phonation normal. Neck supple. No tracheal tenderness and no muscular tenderness present. No tracheal deviation and normal range of motion present. No mass and no thyromegaly present.  Cardiovascular: Normal rate, regular rhythm, normal heart sounds and normal pulses.   Pulses:      Radial pulses are 2+ on the right side, and 2+  on the left side.  Pulmonary/Chest: Effort normal and breath sounds normal. No accessory muscle usage or stridor. No respiratory distress.  Abdominal: Soft. Normal appearance and bowel sounds are normal. There is no tenderness.  Musculoskeletal:       Right shoulder: She exhibits tenderness. She exhibits normal range of motion, no bony tenderness, no swelling, no effusion, no crepitus, no deformity, no laceration, no pain, no spasm, normal pulse and normal strength.  Mild generalized  tenderness of right shoulder with palpation and ROM. Patient most uncomfortable with overhead reach.   Lymphadenopathy:    She has no cervical adenopathy.       Right: No supraclavicular adenopathy present.       Left: No supraclavicular adenopathy present.  Neurological: She is alert and oriented to person, place, and time. She has normal strength. No sensory deficit. Coordination and gait normal.  Skin: Skin is dry. No rash noted.  Psychiatric: She has a normal mood and affect. Her speech is normal and behavior is normal. Thought content normal.    ED Course  Procedures (including critical care time) Labs Review Labs Reviewed - No data to display  Imaging Review No results found.   MDM   1. Right shoulder strain    Right shoulder strain from repetitive use. Will give dose of ibuprofen 800mg  here and send patient with instructions for use of OTC ibuprofen over next 5-7 days. Ice in evenings. HX and exam do not suggest cardiopulmonary origin of pain.   [place], [name] 02/11/14 1003

## 2014-02-11 NOTE — Discharge Instructions (Signed)
Shoulder Sprain  A shoulder sprain is the result of damage to the tough, fiber-like tissues (ligaments) that help hold your shoulder in place. The ligaments may be stretched or torn. Besides the main shoulder joint (the ball and socket), there are several smaller joints that connect the bones in this area. A sprain usually involves one of those joints. Most often it is the acromioclavicular (or AC) joint. That is the joint that connects the collarbone (clavicle) and the shoulder blade (scapula) at the top point of the shoulder blade (acromion).  A shoulder sprain is a mild form of what is called a shoulder separation. Recovering from a shoulder sprain may take some time. For some, pain lingers for several months. Most people recover without long term problems.  CAUSES    A shoulder sprain is usually caused by some kind of trauma. This might be:   Falling on an outstretched arm.   Being hit hard on the shoulder.   Twisting the arm.   Shoulder sprains are more likely to occur in people who:   Play sports.   Have balance or coordination problems.  SYMPTOMS    Pain when you move your shoulder.   Limited ability to move the shoulder.   Swelling and tenderness on top of the shoulder.   Redness or warmth in the shoulder.   Bruising.   A change in the shape of the shoulder.  DIAGNOSIS   Your healthcare provider may:   Ask about your symptoms.   Ask about recent activity that might have caused those symptoms.   Examine your shoulder. You may be asked to do simple exercises to test movement. The other shoulder will be examined for comparison.   Order some tests that provide a look inside the body. They can show the extent of the injury. The tests could include:   X-rays.   CT (computed tomography) scan.   MRI (magnetic resonance imaging) scan.  RISKS AND COMPLICATIONS   Loss of full shoulder motion.   Ongoing shoulder pain.  TREATMENT   How long it takes to recover from a shoulder sprain depends on how  severe it was. Treatment options may include:   Rest. You should not use the arm or shoulder until it heals.   Ice. For 2 or 3 days after the injury, put an ice pack on the shoulder up to 4 times a day. It should stay on for 15 to 20 minutes each time. Wrap the ice in a towel so it does not touch your skin.   Over-the-counter medicine to relieve pain.   A sling or brace. This will keep the arm still while the shoulder is healing.   Physical therapy or rehabilitation exercises. These will help you regain strength and motion. Ask your healthcare provider when it is OK to begin these exercises.   Surgery. The need for surgery is rare with a sprained shoulder, but some people may need surgery to keep the joint in place and reduce pain.  HOME CARE INSTRUCTIONS    Ask your healthcare provider about what you should and should not do while your shoulder heals.   Make sure you know how to apply ice to the correct area of your shoulder.   Talk with your healthcare provider about which medications should be used for pain and swelling.   If rehabilitation therapy will be needed, ask your healthcare provider to refer you to a therapist. If it is not recommended, then ask about at-home exercises. Find   out when exercise should begin.  SEEK MEDICAL CARE IF:   Your pain, swelling, or redness at the joint increases.  SEEK IMMEDIATE MEDICAL CARE IF:    You have a fever.   You cannot move your arm or shoulder.  Document Released: 01/25/2009 Document Revised: 12/01/2011 Document Reviewed: 01/25/2009  [employer] Patient Information 2014 [employer].

## 2014-02-11 NOTE — ED Notes (Signed)
Patient complains of pain in right shoulder that started two weeks ago; states pain radiates from neck to elbow.

## 2014-02-16 ENCOUNTER — Encounter (INDEPENDENT_AMBULATORY_CARE_PROVIDER_SITE_OTHER): Payer: Self-pay | Admitting: Surgery

## 2014-04-03 ENCOUNTER — Ambulatory Visit (INDEPENDENT_AMBULATORY_CARE_PROVIDER_SITE_OTHER): Payer: Medicaid Other | Admitting: Surgery

## 2014-04-14 ENCOUNTER — Encounter (INDEPENDENT_AMBULATORY_CARE_PROVIDER_SITE_OTHER): Payer: Self-pay | Admitting: Surgery

## 2014-04-14 ENCOUNTER — Ambulatory Visit (INDEPENDENT_AMBULATORY_CARE_PROVIDER_SITE_OTHER): Payer: Medicaid Other | Admitting: Surgery

## 2014-04-14 VITALS — BP 126/74 | HR 73 | Temp 97.0°F | Ht 60.0 in | Wt 170.0 lb

## 2014-04-14 DIAGNOSIS — D179 Benign lipomatous neoplasm, unspecified: Secondary | ICD-10-CM

## 2014-04-14 NOTE — Patient Instructions (Signed)
Call if you want to schedule surgery.

## 2014-04-14 NOTE — Progress Notes (Signed)
Patient ID: Carrie Shelton, female   DOB: July 07, 1971, 43 y.o.   MRN: [id number]  Chief Complaint  Patient presents with  . Lipoma    lower spine    HPI Carrie Shelton is a 43 y.o. female.  Patient sent at the request of Dr Jimmye Norman for a mass over the patient's lower back. She is accompanied by family who can translate since she speaks little [place]. She is also accompanied by a friend. The mass has been present for a number of years. She has a multitude of complaints ranging from a vertigo to low back pain. She underwent CT scanning in May of 2014 for abdominal pain which I reviewed which is unremarkable. The mass is been present for a number of years as far as I can tell talking with patient's family. There is no drainage. There is no redness. He is causing mild to moderate discomfort when she lies or sits. She does complain of low back pain. Pt here for 6 month follow up.  She is working and has back pain when she  Bends.  Not any bigger but softer.  HPI  Past Medical History  Diagnosis Date  . Vertigo   . Anemia     Past Surgical History  Procedure Laterality Date  . Dilation and curettage of uterus      daughter is not really sure this is the only word she could remember    History reviewed. No pertinent family history.  Social History History  Substance Use Topics  . Smoking status: Never Smoker   . Smokeless tobacco: Never Used  . Alcohol Use: No    No Known Allergies    Review of Systems Review of Systems  Constitutional: Positive for fatigue.  HENT: Positive for postnasal drip.   Eyes: Negative.   Respiratory: Negative.   Gastrointestinal: Positive for nausea and vomiting.  Genitourinary: Negative.   Musculoskeletal: Positive for arthralgias and back pain.  Neurological: Positive for dizziness.  Hematological: Negative.   Psychiatric/Behavioral: Negative.     Blood pressure 126/74, pulse 73, temperature 97 F (36.1 C), height 5' (1.524 m), weight 170 lb  (77.111 kg).  Physical Exam Physical Exam  Constitutional: She is oriented to person, place, and time. She appears well-developed and well-nourished.  HENT:  Head: Normocephalic and atraumatic.  Eyes: Pupils are equal, round, and reactive to light. No scleral icterus.  Neck: Normal range of motion. Neck supple.  Musculoskeletal: Normal range of motion.  Neurological: She is alert and oriented to person, place, and time.  Skin:     Psychiatric: She has a normal mood and affect. Her behavior is normal. Judgment and thought content normal.    Data Reviewed CT abdomen pelvis May 2014  Assessment    Lipoma over the coccyx 5 cm    Plan       Pt is about the same.  She is considering removal and will let me know.     Oren Barella A. 04/14/2014, 9:19 AM

## 2014-05-13 ENCOUNTER — Emergency Department (HOSPITAL_COMMUNITY)
Admission: EM | Admit: 2014-05-13 | Discharge: 2014-05-14 | Disposition: A | Discharge status: Home / Self Care | Payer: Medicaid Other | Attending: Emergency Medicine | Admitting: Emergency Medicine

## 2014-05-13 ENCOUNTER — Encounter (HOSPITAL_COMMUNITY): Payer: Self-pay | Admitting: Radiology

## 2014-05-13 ENCOUNTER — Emergency Department (HOSPITAL_COMMUNITY): Payer: Medicaid Other

## 2014-05-13 DIAGNOSIS — Z9889 Other specified postprocedural states: Secondary | ICD-10-CM | POA: Insufficient documentation

## 2014-05-13 DIAGNOSIS — R1013 Epigastric pain: Secondary | ICD-10-CM | POA: Insufficient documentation

## 2014-05-13 DIAGNOSIS — K8050 Calculus of bile duct without cholangitis or cholecystitis without obstruction: Secondary | ICD-10-CM

## 2014-05-13 DIAGNOSIS — K8020 Calculus of gallbladder without cholecystitis without obstruction: Secondary | ICD-10-CM | POA: Insufficient documentation

## 2014-05-13 DIAGNOSIS — R945 Abnormal results of liver function studies: Secondary | ICD-10-CM | POA: Insufficient documentation

## 2014-05-13 DIAGNOSIS — R7989 Other specified abnormal findings of blood chemistry: Secondary | ICD-10-CM

## 2014-05-13 DIAGNOSIS — Z862 Personal history of diseases of the blood and blood-forming organs and certain disorders involving the immune mechanism: Secondary | ICD-10-CM | POA: Insufficient documentation

## 2014-05-13 LAB — I-STAT CHEM 8, ED
BUN: 9 mg/dL (ref 6–23)
Calcium, Ion: 1.1 mmol/L — ABNORMAL LOW (ref 1.12–1.23)
Chloride: 106 mEq/L (ref 96–112)
Creatinine, Ser: 0.5 mg/dL (ref 0.50–1.10)
Glucose, Bld: 123 mg/dL — ABNORMAL HIGH (ref 70–99)
HCT: 40 % (ref 36.0–46.0)
Hemoglobin: 13.6 g/dL (ref 12.0–15.0)
Potassium: 3.3 mEq/L — ABNORMAL LOW (ref 3.7–5.3)
Sodium: 140 mEq/L (ref 137–147)
TCO2: 23 mmol/L (ref 0–100)

## 2014-05-13 LAB — CBC WITH DIFFERENTIAL/PLATELET
Basophils Absolute: 0 10*3/uL (ref 0.0–0.1)
Basophils Relative: 0 % (ref 0–1)
Eosinophils Absolute: 0.1 10*3/uL (ref 0.0–0.7)
Eosinophils Relative: 1 % (ref 0–5)
HCT: 38.2 % (ref 36.0–46.0)
Hemoglobin: 12.3 g/dL (ref 12.0–15.0)
Lymphocytes Relative: 34 % (ref 12–46)
Lymphs Abs: 2.3 10*3/uL (ref 0.7–4.0)
MCH: 26.1 pg (ref 26.0–34.0)
MCHC: 32.2 g/dL (ref 30.0–36.0)
MCV: 81.1 fL (ref 78.0–100.0)
Monocytes Absolute: 0.8 10*3/uL (ref 0.1–1.0)
Monocytes Relative: 11 % (ref 3–12)
Neutro Abs: 3.7 10*3/uL (ref 1.7–7.7)
Neutrophils Relative %: 54 % (ref 43–77)
Platelets: 332 10*3/uL (ref 150–400)
RBC: 4.71 MIL/uL (ref 3.87–5.11)
RDW: 14.5 % (ref 11.5–15.5)
WBC: 6.9 10*3/uL (ref 4.0–10.5)

## 2014-05-13 LAB — COMPREHENSIVE METABOLIC PANEL
ALT: 81 U/L — ABNORMAL HIGH (ref 0–35)
AST: 194 U/L — ABNORMAL HIGH (ref 0–37)
Albumin: 3.1 g/dL — ABNORMAL LOW (ref 3.5–5.2)
Alkaline Phosphatase: 97 U/L (ref 39–117)
Anion gap: 13 (ref 5–15)
BUN: 10 mg/dL (ref 6–23)
CO2: 23 mEq/L (ref 19–32)
Calcium: 8.4 mg/dL (ref 8.4–10.5)
Chloride: 103 mEq/L (ref 96–112)
Creatinine, Ser: 0.53 mg/dL (ref 0.50–1.10)
GFR calc Af Amer: >90 mL/min (ref >90)
GFR calc non Af Amer: >90 mL/min (ref >90)
Glucose, Bld: 121 mg/dL — ABNORMAL HIGH (ref 70–99)
Potassium: 3.5 mEq/L — ABNORMAL LOW (ref 3.7–5.3)
Sodium: 139 mEq/L (ref 137–147)
Total Bilirubin: 0.4 mg/dL (ref 0.3–1.2)
Total Protein: 6.5 g/dL (ref 6.0–8.3)

## 2014-05-13 LAB — URINALYSIS, ROUTINE W REFLEX MICROSCOPIC
Bilirubin Urine: NEGATIVE
Glucose, UA: NEGATIVE mg/dL
Hgb urine dipstick: NEGATIVE
Ketones, ur: NEGATIVE mg/dL
Leukocytes, UA: NEGATIVE
Nitrite: NEGATIVE
Protein, ur: NEGATIVE mg/dL
Specific Gravity, Urine: 1.015 (ref 1.005–1.030)
Urobilinogen, UA: 0.2 mg/dL (ref 0.0–1.0)
pH: 7 (ref 5.0–8.0)

## 2014-05-13 LAB — I-STAT CG4 LACTIC ACID, ED: Lactic Acid, Venous: 1.66 mmol/L (ref 0.5–2.2)

## 2014-05-13 LAB — I-STAT TROPONIN, ED: Troponin i, poc: 0 ng/mL (ref 0.00–0.08)

## 2014-05-13 MED ORDER — IOHEXOL 350 MG/ML SOLN
100.0000 mL | Freq: Once | INTRAVENOUS | Status: AC | PRN
  Administered 2014-05-13: 100 mL via INTRAVENOUS

## 2014-05-13 MED ORDER — OXYCODONE-ACETAMINOPHEN 5-325 MG PO TABS: 1.0000 | ORAL_TABLET | Freq: Four times a day (QID) | ORAL | Status: DC | PRN

## 2014-05-13 MED ORDER — SODIUM CHLORIDE 0.9 % IV BOLUS (SEPSIS)
1000.0000 mL | Freq: Once | INTRAVENOUS | Status: AC
  Administered 2014-05-13: 1000 mL via INTRAVENOUS

## 2014-05-13 MED ORDER — FENTANYL CITRATE 0.05 MG/ML IJ SOLN
50.0000 ug | Freq: Once | INTRAMUSCULAR | Status: AC
  Administered 2014-05-13: 50 ug via INTRAVENOUS
  Filled 2014-05-13: qty 2

## 2014-05-13 NOTE — ED Notes (Signed)
Patients abdominal pain started suddenly at 4pm today and has increased in pain since.

## 2014-05-13 NOTE — ED Notes (Signed)
EDP at bedside  

## 2014-05-13 NOTE — ED Notes (Signed)
Lactic Acid results shown to Dr. Allie Bossier

## 2014-05-13 NOTE — ED Provider Notes (Signed)
CSN: [id number]     Arrival date & time 05/13/14  2014 History   First MD Initiated Contact with Patient 05/13/14 2028     Chief Complaint  Patient presents with  . Abdominal Pain     (Consider location/radiation/quality/duration/timing/severity/associated sxs/prior Treatment) The history is provided by the patient.  Carrie Shelton is a 43 y.o. female hx of anemia here with abdominal pain. Acute onset of epigastric pain at 4pm. Patient is sharp and radiated directly to lower abdomen. Denies flank pain. Had some back pain as well. Denies urinary symptoms. Denies previous abdominal surgeries.    Past Medical History  Diagnosis Date  . Vertigo   . Anemia    Past Surgical History  Procedure Laterality Date  . Dilation and curettage of uterus      daughter is not really sure this is the only word she could remember   No family history on file. History  Substance Use Topics  . Smoking status: Never Smoker   . Smokeless tobacco: Never Used  . Alcohol Use: No   OB History   Grav Para Term Preterm Abortions TAB SAB Ect Mult Living                 Review of Systems  Gastrointestinal: Positive for abdominal pain.  All other systems reviewed and are negative.     Allergies  Review of patient's allergies indicates no known allergies.  Home Medications   Prior to Admission medications   Not on File   BP 111/57  Pulse 71  Temp(Src) 99 F (37.2 C) (Oral)  Resp 20  Ht 5\' 4"  (1.626 m)  Wt 170 lb (77.111 kg)  BMI 29.17 kg/m2  SpO2 99%  LMP 05/04/2014 Physical Exam  Nursing note and vitals reviewed. Constitutional: She is oriented to person, place, and time.  Uncomfortable, writhing around   HENT:  Head: Normocephalic.  Mouth/Throat: Oropharynx is clear and moist.  Eyes: Conjunctivae and EOM are normal. Pupils are equal, round, and reactive to light.  Neck: Normal range of motion. Neck supple.  Cardiovascular: Normal rate, regular rhythm and normal heart sounds.    Pulmonary/Chest: Effort normal and breath sounds normal. No respiratory distress. She has no wheezes. She has no rales.  Abdominal: Soft. Bowel sounds are normal.  Mild diffuse tenderness, no rebound. No obvious bruit or pulsatile mass   Musculoskeletal: Normal range of motion. She exhibits no edema and no tenderness.  Neurological: She is alert and oriented to person, place, and time.  Skin: Skin is warm and dry.  Psychiatric: She has a normal mood and affect. Her behavior is normal. Judgment and thought content normal.    ED Course  Procedures (including critical care time)  EMERGENCY DEPARTMENT [place] ABD/AORTA EXAM Study: Limited Ultrasound of the Abdominal Aorta.  INDICATIONS:Hypotension Indication: Multiple views of the abdominal aorta are obtained from the diaphragmatic hiatus to the aortic bifurcation in transverse and sagittal planes with a multi- Frequency probe.  PERFORMED BY: Myself  IMAGES ARCHIVED?: Yes  FINDINGS: Free fluid absent  LIMITATIONS:  Body habitus  INTERPRETATION:  No abdominal aortic aneurysm  COMMENT:  No obvious AAA    EMERGENCY DEPARTMENT US GALLBLADDER INTERPRETATION "Study: Limited Ultrasound of the gallbladder and common bile duct."  INDICATIONS: RUQ pain Indication: Multiple views of the gallbladder and common bile duct are obtained with a  Multi-frequency probe."  PERFORMED BY:  Myself  IMAGES ARCHIVED?: Yes  FINDINGS: Gallstones present, Gallbladder enlarged, Gallbladder wall normal in thickness, Sonographic Murphy's  sign absent and Common bile duct normal in size  LIMITATIONS: Body Habitus  INTERPRETATION: Cholelithiasis  COMMENT:  Cholelithiasis no cholecystitis    Labs Review Labs Reviewed  COMPREHENSIVE METABOLIC PANEL - Abnormal; Notable for the following:    Potassium 3.5 (*)    Glucose, Bld 121 (*)    Albumin 3.1 (*)    AST 194 (*)    ALT 81 (*)    All other components within normal limits  I-STAT CHEM 8, ED -  Abnormal; Notable for the following:    Sodium 133 (*)    Potassium 8.3 (*)    Calcium, Ion 0.99 (*)    All other components within normal limits  I-STAT CHEM 8, ED - Abnormal; Notable for the following:    Potassium 3.3 (*)    Glucose, Bld 123 (*)    Calcium, Ion 1.10 (*)    All other components within normal limits  CBC WITH DIFFERENTIAL  URINALYSIS, ROUTINE W REFLEX MICROSCOPIC  I-STAT CG4 LACTIC ACID, ED  I-STAT TROPOININ, ED    Imaging Review Ct Cta Abd/pel W/cm &/or W/o Cm  05/13/2014   CLINICAL DATA:  Abdominal pain.  Rule out aortic dissection  EXAM: CTA ABDOMEN AND PELVIS wITHOUT AND WITH CONTRAST  TECHNIQUE: Multidetector CT imaging of the abdomen and pelvis was performed using the standard protocol during bolus administration of intravenous contrast. Multiplanar reconstructed images and MIPs were obtained and reviewed to evaluate the vascular anatomy.  CONTRAST:  118mL OMNIPAQUE IOHEXOL 350 MG/ML SOLN  COMPARISON:  03/11/2013  FINDINGS: BODY WALL: Unremarkable.  LOWER CHEST: Unremarkable.  ABDOMEN/PELVIS:  Liver: No focal abnormality.  Biliary: There is evidence of cholelithiasis with gallbladder wall thickening and surrounding edema.  Pancreas: Unremarkable.  Spleen: Unremarkable.  Adrenals: Unremarkable.  Kidneys and ureters: No hydronephrosis or stone.  Bladder: Distended but otherwise unremarkable.  Reproductive: Unremarkable.  Bowel: No obstruction. No pericecal inflammation.  Retroperitoneum: 3.9 cm right ovarian cyst which appears simple. This is not present in 2014, strongly suggesting a functional origin.  Peritoneum: No ascites or pneumoperitoneum.  Vascular: No unusual aortic branching. No definitive atherosclerosis. No aneurysm or dissection. No major vessel occlusion.  OSSEOUS: No acute abnormalities.  Review of the MIP images confirms the above findings.  IMPRESSION: 1. Normal abdominal aorta. 2. Cholelithiasis and gallbladder wall thickening suggests gallbladder  obstruction or acute cholecystitis. 3. 4 cm simple right ovarian cyst. Recommend follow-up ultrasound in 6 to 12 weeks to document normalization.   Electronically Signed   By: Jorje Guild M.D.   On: 05/13/2014 23:09     EKG Interpretation None      MDM   Final diagnoses:  None    Carrie Shelton is a 43 y.o. female here with ab pain. Was normotensive in triage. Became hypotensive to 90s when I examined her. I performed bedside US and didn't see obvious AAA. I was concerned for AAA vs dissection vs kidney stone. Will get CT angio ab/pel to further assess. To give IVF for hypotension.   11:39 PM Not hypotensive with IVF. CT showed possible cholecystitis. LFTs slightly elevated. However, patient only given one dose of fentanyl and has been pain free for hours. She requests to go home. [place] bedside showed cholelithiasis but no obvious cholecystitis. Will give short course of pain meds, surgery referral.    Wandra Arthurs, MD 05/13/14 2340

## 2014-05-13 NOTE — ED Notes (Addendum)
EDP made aware of patients K+. Holding off on CT until Crt comes back.

## 2014-05-13 NOTE — Discharge Instructions (Signed)
Take percocet as needed for severe pain. Do NOT drive with it.  Eat low fat food.   Follow up with surgery.   Your liver function tests are slightly elevated and needs to be rechecked in a week.   Return to ER if you have severe pain, vomiting, fever.    Biliary Colic  Biliary colic is a steady or irregular pain in the upper abdomen. It is usually under the right side of the rib cage. It happens when gallstones interfere with the normal flow of bile from the gallbladder. Bile is a liquid that helps to digest fats. Bile is made in the liver and stored in the gallbladder. When you eat a meal, bile passes from the gallbladder through the cystic duct and the common bile duct into the small intestine. There, it mixes with partially digested food. If a gallstone blocks either of these ducts, the normal flow of bile is blocked. The muscle cells in the bile duct contract forcefully to try to move the stone. This causes the pain of biliary colic.  SYMPTOMS   A person with biliary colic usually complains of pain in the upper abdomen. This pain can be:  In the center of the upper abdomen just below the breastbone.  In the upper-right part of the abdomen, near the gallbladder and liver.  Spread back toward the right shoulder blade.  Nausea and vomiting.  The pain usually occurs after eating.  Biliary colic is usually triggered by the digestive system's demand for bile. The demand for bile is high after fatty meals. Symptoms can also occur when a person who has been fasting suddenly eats a very large meal. Most episodes of biliary colic pass after 1 to 5 hours. After the most intense pain passes, your abdomen may continue to ache mildly for about 24 hours. DIAGNOSIS  After you describe your symptoms, your caregiver will perform a physical exam. He or she will pay attention to the upper right portion of your belly (abdomen). This is the area of your liver and gallbladder. An ultrasound will help your  caregiver look for gallstones. Specialized scans of the gallbladder may also be done. Blood tests may be done, especially if you have fever or if your pain persists. PREVENTION  Biliary colic can be prevented by controlling the risk factors for gallstones. Some of these risk factors, such as heredity, increasing age, and pregnancy are a normal part of life. Obesity and a high-fat diet are risk factors you can change through a healthy lifestyle. Women going through menopause who take hormone replacement therapy (estrogen) are also more likely to develop biliary colic. TREATMENT   Pain medication may be prescribed.  You may be encouraged to eat a fat-free diet.  If the first episode of biliary colic is severe, or episodes of colic keep retuning, surgery to remove the gallbladder (cholecystectomy) is usually recommended. This procedure can be done through small incisions using an instrument called a laparoscope. The procedure often requires a brief stay in the hospital. Some people can leave the hospital the same day. It is the most widely used treatment in people troubled by painful gallstones. It is effective and safe, with no complications in more than 90% of cases.  If surgery cannot be done, medication that dissolves gallstones may be used. This medication is expensive and can take months or years to work. Only small stones will dissolve.  Rarely, medication to dissolve gallstones is combined with a procedure called shock-wave lithotripsy. This procedure  uses carefully aimed shock waves to break up gallstones. In many people treated with this procedure, gallstones form again within a few years. PROGNOSIS  If gallstones block your cystic duct or common bile duct, you are at risk for repeated episodes of biliary colic. There is also a 25% chance that you will develop a gallbladder infection(acute cholecystitis), or some other complication of gallstones within 10 to 20 years. If you have surgery,  schedule it at a time that is convenient for you and at a time when you are not sick. HOME CARE INSTRUCTIONS   Drink plenty of clear fluids.  Avoid fatty, greasy or fried foods, or any foods that make your pain worse.  Take medications as directed. SEEK MEDICAL CARE IF:   You develop a fever over 100.5 F (38.1 C).  Your pain gets worse over time.  You develop nausea that prevents you from eating and drinking.  You develop vomiting. SEEK IMMEDIATE MEDICAL CARE IF:   You have continuous or severe belly (abdominal) pain which is not relieved with medications.  You develop nausea and vomiting which is not relieved with medications.  You have symptoms of biliary colic and you suddenly develop a fever and shaking chills. This may signal cholecystitis. Call your caregiver immediately.  You develop a yellow color to your skin or the white part of your eyes (jaundice). Document Released: 02/09/2006 Document Revised: 12/01/2011 Document Reviewed: 04/20/2008 [facility] Patient Information 2015 [place], [place]. This information is not intended to replace advice given to you by your health care provider. Make sure you discuss any questions you have with your health care provider.

## 2014-05-13 NOTE — ED Notes (Signed)
Patient presents very restless with lower abd pain that started epigastric pain and now lower sbd pain.  Denies urinary symptoms

## 2014-05-15 LAB — I-STAT CHEM 8, ED
BUN: 14 mg/dL (ref 6–23)
Calcium, Ion: 0.99 mmol/L — ABNORMAL LOW (ref 1.12–1.23)
Chloride: 108 mEq/L (ref 96–112)
Creatinine, Ser: 0.6 mg/dL (ref 0.50–1.10)
Glucose, Bld: 99 mg/dL (ref 70–99)
HCT: 43 % (ref 36.0–46.0)
Hemoglobin: 14.6 g/dL (ref 12.0–15.0)
Potassium: 8.3 mEq/L (ref 3.7–5.3)
Sodium: 133 mEq/L — ABNORMAL LOW (ref 137–147)
TCO2: 31 mmol/L (ref 0–100)

## 2014-06-09 ENCOUNTER — Other Ambulatory Visit (INDEPENDENT_AMBULATORY_CARE_PROVIDER_SITE_OTHER): Payer: Self-pay | Admitting: Surgery

## 2014-06-09 NOTE — H&P (Signed)
Carrie Shelton 06/09/2014 10:25 AM Location: [facility] Patient #: [id number] DOB: 1971/08/18 Single / Language: Hindi / Race: Refused to Report/Unreported Female History of Present Illness (Carrie Moret [name] MD; 06/09/2014 3:59 PM) Patient words: Gallstones and pain Pt with 3 month hx of intermittent RUQ abdominal pain severe sharp lasting minutes to hours and intermittent. Not related to food. Seen in ED CT revealed gallstones and thickened gallbladder wall last month.  The patient is a 43 year old female who presents for evaluation of gall stones. The onset of the gall stones has been acute and has been occurring in an intermittent pattern for 3 months. The course has been gradually worsening. The gall stones is described as severe. There has been associated abdominal pain, anorexia and back pain. There were no precipitating factors. There are no relieving factors. Other Problems Carrie Shelton, [name]; 06/09/2014 10:25 AM) No pertinent past medical history  Past Surgical History ([place], [name]; 06/09/2014 10:25 AM) Resection of Stomach  Diagnostic Studies History ([place], [name]; 06/09/2014 10:25 AM) Pap Smear never  Allergies [facility], [name]; 06/09/2014 10:31 AM) No Known Drug Allergies 06/08/2014  Medication History ([name], [place]; 06/09/2014 10:31 AM) No Current Medications  Social History ([name], [name]; 06/09/2014 10:25 AM) Caffeine use Tea. No alcohol use No drug use Tobacco use Never smoker.  Family History ([name], [name]; 06/09/2014 10:25 AM) First Degree Relatives No pertinent family history  Pregnancy / Birth History ([name], [name]; 06/09/2014 10:25 AM) Age at menarche 47 years. Gravida 2 Maternal age 8-20 Para 2 Regular periods     Review of Systems ([place] [name]; 06/09/2014 10:25 AM) General Not Present- Appetite Loss, Chills,  Fatigue, Fever, Night Sweats, Weight Gain and Weight Loss. Skin Not Present- Change in Wart/Mole, Dryness, Hives, Jaundice, New Lesions, Non-Healing Wounds, Rash and Ulcer. HEENT Not Present- Earache, Hearing Loss, Hoarseness, Nose Bleed, Oral Ulcers, Ringing in the Ears, Seasonal Allergies, Sinus Pain, Sore Throat, Visual Disturbances, Wears glasses/contact lenses and Yellow Eyes. Respiratory Not Present- Bloody sputum, Chronic Cough, Difficulty Breathing, Snoring and Wheezing. Breast Not Present- Breast Mass, Breast Pain, Nipple Discharge and Skin Changes. Cardiovascular Not Present- Chest Pain, Difficulty Breathing Lying Down, Leg Cramps, Palpitations, Rapid Heart Rate, Shortness of Breath and Swelling of Extremities. Gastrointestinal Not Present- Abdominal Pain, Bloating, Bloody Stool, Change in Bowel Habits, Chronic diarrhea, Constipation, Difficulty Swallowing, Excessive gas, Gets full quickly at meals, Hemorrhoids, Indigestion, Nausea, Rectal Pain and Vomiting. Female Genitourinary Not Present- Frequency, Nocturia, Painful Urination, Pelvic Pain and Urgency. Musculoskeletal Not Present- Back Pain, Joint Pain, Joint Stiffness, Muscle Pain, Muscle Weakness and Swelling of Extremities. Neurological Not Present- Decreased Memory, Fainting, Headaches, Numbness, Seizures, Tingling, Tremor, Trouble walking and Weakness. Psychiatric Not Present- Anxiety, Bipolar, Change in Sleep Pattern, Depression, Fearful and Frequent crying. Endocrine Not Present- Cold Intolerance, Excessive Hunger, Hair Changes, Heat Intolerance, Hot flashes and New Diabetes. Hematology Not Present- Easy Bruising, Excessive bleeding, Gland problems, HIV and Persistent Infections.  Vitals ([name] Maldonado [name]; 06/09/2014 10:28 AM) 06/09/2014 10:25 AM Weight: 167.4 lb Height: 61in Body Surface Area: 1.81 m Body Mass Index: 31.63 kg/m Temp.: 98.70F(Oral)  Pulse: 64 (Regular)  P.OX: 97% (Room air) BP: 98/60  (Sitting, Left Arm, Standard)     Physical Exam (Zedekiah Hinderman A. Debbie Bellucci MD; 06/09/2014 4:00 PM)  General Mental Status-Alert. General Appearance-Consistent with stated age. Hydration-Well hydrated. Voice-Normal.  Head and Neck Head-normocephalic, atraumatic with no lesions or palpable masses. Trachea-midline. Thyroid Gland Characteristics - normal size and consistency.  Eye Eyeball - Bilateral-Extraocular movements intact. Sclera/Conjunctiva - Bilateral-No scleral icterus.  Chest and Lung Exam Chest and lung exam reveals -quiet, even and easy respiratory effort with no use of accessory muscles, normal resonance, no flatness or dullness, non-tender and normal tactile fremitus and on auscultation, normal breath sounds, no adventitious sounds and normal vocal resonance. Inspection Chest Wall - Normal. Back - normal.  Breast Breast - Left-Symmetric, Non Tender, No Biopsy scars, no Dimpling, No Inflammation, No Lumpectomy scars, No Mastectomy scars, No Peau d' Orange. Breast - Right-Symmetric, Non Tender, No Biopsy scars, no Dimpling, No Inflammation, No Lumpectomy scars, No Mastectomy scars, No Peau d' Orange. Breast Lump-No Palpable Breast Mass.  Cardiovascular Cardiovascular examination reveals -on palpation PMI is normal in location and amplitude, no palpable S3 or S4. Normal cardiac borders., normal heart sounds, regular rate and rhythm with no murmurs, carotid auscultation reveals no bruits and normal pedal pulses bilaterally.  Abdomen Inspection Inspection of the abdomen reveals - No Hernias. Skin - Scar - no surgical scars. Palpation/Percussion Palpation and Percussion of the abdomen reveal - Soft, Non Tender, No Rebound tenderness, No Rigidity (guarding) and No hepatosplenomegaly. Auscultation Auscultation of the abdomen reveals - Bowel sounds normal.  Rectal - Did not examine.  Peripheral Vascular Upper Extremity Inspection - Bilateral - Normal  - No Clubbing, No Cyanosis, No Edema, Pulses Intact. Palpation - Pulses bilaterally normal. Lower Extremity Palpation - Pulses bilaterally normal.  Neurologic Neurologic evaluation reveals -alert and oriented x 3 with no impairment of recent or remote memory. Mental Status-Normal.  Musculoskeletal Normal Exam - Left-Upper Extremity Strength Normal and Lower Extremity Strength Normal. Normal Exam - Right-Upper Extremity Strength Normal, Lower Extremity Weakness.  Lymphatic Head & Neck  General Head & Neck Lymphatics: Bilateral - Description - Normal. Axillary  General Axillary Region: Bilateral - Description - Normal. Tenderness - Non Tender. Femoral & Inguinal  Generalized Femoral & Inguinal Lymphatics: Bilateral - Description - Normal. Tenderness - Non Tender.    Assessment & Plan (Moxie Kalil A. Aasiya Creasey MD; 06/09/2014 11:22 AM)  RECURRENT BILIARY COLIC (016.01  U93.23)  Current Plans LAPAROSCOPIC CHOLECYSTECTOMY W CHOLANGIOGRAPHY [phone]) Pt Education - Laparoscopic Cholecystectomy: cholecystectomy

## 2014-06-15 ENCOUNTER — Encounter (HOSPITAL_COMMUNITY): Payer: Self-pay | Admitting: Emergency Medicine

## 2014-06-15 ENCOUNTER — Emergency Department (HOSPITAL_COMMUNITY)
Admission: EM | Admit: 2014-06-15 | Discharge: 2014-06-15 | Disposition: A | Discharge status: Home / Self Care | Payer: Medicaid Other | Attending: Emergency Medicine | Admitting: Emergency Medicine

## 2014-06-15 ENCOUNTER — Emergency Department (HOSPITAL_COMMUNITY): Payer: Medicaid Other

## 2014-06-15 DIAGNOSIS — S46909A Unspecified injury of unspecified muscle, fascia and tendon at shoulder and upper arm level, unspecified arm, initial encounter: Secondary | ICD-10-CM | POA: Insufficient documentation

## 2014-06-15 DIAGNOSIS — S0990XA Unspecified injury of head, initial encounter: Secondary | ICD-10-CM | POA: Insufficient documentation

## 2014-06-15 DIAGNOSIS — R209 Unspecified disturbances of skin sensation: Secondary | ICD-10-CM | POA: Insufficient documentation

## 2014-06-15 DIAGNOSIS — S199XXA Unspecified injury of neck, initial encounter: Principal | ICD-10-CM

## 2014-06-15 DIAGNOSIS — S4980XA Other specified injuries of shoulder and upper arm, unspecified arm, initial encounter: Secondary | ICD-10-CM | POA: Insufficient documentation

## 2014-06-15 DIAGNOSIS — Z862 Personal history of diseases of the blood and blood-forming organs and certain disorders involving the immune mechanism: Secondary | ICD-10-CM | POA: Diagnosis not present

## 2014-06-15 DIAGNOSIS — S0993XA Unspecified injury of face, initial encounter: Secondary | ICD-10-CM | POA: Insufficient documentation

## 2014-06-15 DIAGNOSIS — Y92410 Unspecified street and highway as the place of occurrence of the external cause: Secondary | ICD-10-CM | POA: Diagnosis not present

## 2014-06-15 DIAGNOSIS — Y9389 Activity, other specified: Secondary | ICD-10-CM | POA: Diagnosis not present

## 2014-06-15 MED ORDER — METHOCARBAMOL 500 MG PO TABS: 500.0000 mg | ORAL_TABLET | Freq: Two times a day (BID) | ORAL | Status: DC

## 2014-06-15 MED ORDER — IBUPROFEN 800 MG PO TABS: 800.0000 mg | ORAL_TABLET | Freq: Three times a day (TID) | ORAL | Status: DC

## 2014-06-15 MED ORDER — IBUPROFEN 400 MG PO TABS
800.0000 mg | ORAL_TABLET | Freq: Once | ORAL | Status: AC
  Administered 2014-06-15: 800 mg via ORAL
  Filled 2014-06-15: qty 2

## 2014-06-15 NOTE — Discharge Instructions (Signed)
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
## 2014-06-16 NOTE — ED Provider Notes (Signed)
Medical screening examination/treatment/procedure(s) were performed by non-physician practitioner and as supervising physician I was immediately available for consultation/collaboration.   Houston Siren III, MD 06/16/14 [phone]

## 2014-07-26 ENCOUNTER — Encounter (HOSPITAL_COMMUNITY)
Admission: RE | Admit: 2014-07-26 | Discharge: 2014-07-26 | Disposition: A | Discharge status: Home / Self Care | Payer: Medicaid Other | Source: Ambulatory Visit | Attending: Surgery | Admitting: Surgery

## 2014-07-26 ENCOUNTER — Encounter (HOSPITAL_COMMUNITY): Payer: Self-pay

## 2014-07-26 DIAGNOSIS — K819 Cholecystitis, unspecified: Secondary | ICD-10-CM | POA: Insufficient documentation

## 2014-07-26 DIAGNOSIS — Z01812 Encounter for preprocedural laboratory examination: Secondary | ICD-10-CM | POA: Diagnosis present

## 2014-07-26 LAB — CBC WITH DIFFERENTIAL/PLATELET
Basophils Absolute: 0 10*3/uL (ref 0.0–0.1)
Basophils Relative: 0 % (ref 0–1)
Eosinophils Absolute: 0.2 10*3/uL (ref 0.0–0.7)
Eosinophils Relative: 3 % (ref 0–5)
HCT: 37.4 % (ref 36.0–46.0)
Hemoglobin: 12.1 g/dL (ref 12.0–15.0)
Lymphocytes Relative: 39 % (ref 12–46)
Lymphs Abs: 2.1 10*3/uL (ref 0.7–4.0)
MCH: 26.9 pg (ref 26.0–34.0)
MCHC: 32.4 g/dL (ref 30.0–36.0)
MCV: 83.1 fL (ref 78.0–100.0)
Monocytes Absolute: 0.7 10*3/uL (ref 0.1–1.0)
Monocytes Relative: 12 % (ref 3–12)
Neutro Abs: 2.5 10*3/uL (ref 1.7–7.7)
Neutrophils Relative %: 46 % (ref 43–77)
Platelets: 276 10*3/uL (ref 150–400)
RBC: 4.5 MIL/uL (ref 3.87–5.11)
RDW: 14.5 % (ref 11.5–15.5)
WBC: 5.5 10*3/uL (ref 4.0–10.5)

## 2014-07-26 LAB — COMPREHENSIVE METABOLIC PANEL
ALT: 13 U/L (ref 0–35)
AST: 17 U/L (ref 0–37)
Albumin: 3.3 g/dL — ABNORMAL LOW (ref 3.5–5.2)
Alkaline Phosphatase: 73 U/L (ref 39–117)
Anion gap: 9 (ref 5–15)
BUN: 10 mg/dL (ref 6–23)
CO2: 26 mEq/L (ref 19–32)
Calcium: 8.8 mg/dL (ref 8.4–10.5)
Chloride: 103 mEq/L (ref 96–112)
Creatinine, Ser: 0.54 mg/dL (ref 0.50–1.10)
GFR calc Af Amer: >90 mL/min (ref >90)
GFR calc non Af Amer: >90 mL/min (ref >90)
Glucose, Bld: 98 mg/dL (ref 70–99)
Potassium: 3.9 mEq/L (ref 3.7–5.3)
Sodium: 138 mEq/L (ref 137–147)
Total Bilirubin: 0.2 mg/dL — ABNORMAL LOW (ref 0.3–1.2)
Total Protein: 7.1 g/dL (ref 6.0–8.3)

## 2014-07-26 LAB — HCG, SERUM, QUALITATIVE: Preg, Serum: NEGATIVE

## 2014-07-26 NOTE — Pre-Procedure Instructions (Signed)
Carrie Shelton  07/26/2014   Your procedure is scheduled on:  08/01/14  Report to [facility] Admitting at 945 AM.  Call this number if you have problems the morning of surgery: [phone]   Remember:   Do not eat food or drink liquids after midnight.   Take these medicines the morning of surgery with A SIP OF WATER: none   Do not wear jewelry, make-up or nail polish.  Do not wear lotions, powders, or perfumes. You may wear deodorant.  Do not shave 48 hours prior to surgery. Men may shave face and neck.  Do not bring valuables to the hospital.  [facility] is not responsible                  for any belongings or valuables.               Contacts, dentures or bridgework may not be worn into surgery.  Leave suitcase in the car. After surgery it may be brought to your room.  For patients admitted to the hospital, discharge time is determined by your                treatment team.               Patients discharged the day of surgery will not be allowed to drive  home.  Name and phone number of your driver:  Special Instructions: Incentive Spirometry - Practice and bring it with you on the day of surgery.   Please read over the following fact sheets that you were given: Pain Booklet, Coughing and Deep Breathing and Surgical Site Infection Prevention

## 2014-07-31 MED ORDER — CEFAZOLIN SODIUM-DEXTROSE 2-3 GM-% IV SOLR
2.0000 g | INTRAVENOUS | Status: AC
  Administered 2014-08-01: 2 g via INTRAVENOUS
  Filled 2014-07-31: qty 50

## 2014-08-01 ENCOUNTER — Ambulatory Visit (HOSPITAL_COMMUNITY): Payer: Medicaid Other

## 2014-08-01 ENCOUNTER — Encounter (HOSPITAL_COMMUNITY): Payer: Self-pay | Admitting: Certified Registered"

## 2014-08-01 ENCOUNTER — Ambulatory Visit (HOSPITAL_COMMUNITY): Payer: Medicaid Other | Admitting: Certified Registered"

## 2014-08-01 ENCOUNTER — Ambulatory Visit (HOSPITAL_COMMUNITY)
Admission: RE | Admit: 2014-08-01 | Discharge: 2014-08-01 | Disposition: A | Discharge status: Home / Self Care | Payer: Medicaid Other | Source: Ambulatory Visit | Attending: Surgery | Admitting: Surgery

## 2014-08-01 ENCOUNTER — Encounter (HOSPITAL_COMMUNITY)
Admission: RE | Disposition: A | Discharge status: Home / Self Care | Payer: Self-pay | Source: Ambulatory Visit | Attending: Surgery

## 2014-08-01 DIAGNOSIS — K8020 Calculus of gallbladder without cholecystitis without obstruction: Secondary | ICD-10-CM | POA: Diagnosis present

## 2014-08-01 DIAGNOSIS — D649 Anemia, unspecified: Secondary | ICD-10-CM | POA: Diagnosis not present

## 2014-08-01 DIAGNOSIS — K8010 Calculus of gallbladder with chronic cholecystitis without obstruction: Secondary | ICD-10-CM | POA: Diagnosis not present

## 2014-08-01 HISTORY — PX: CHOLECYSTECTOMY: SHX55

## 2014-08-01 SURGERY — LAPAROSCOPIC CHOLECYSTECTOMY WITH INTRAOPERATIVE CHOLANGIOGRAM
Anesthesia: General | Site: Abdomen

## 2014-08-01 MED ORDER — PROPOFOL 10 MG/ML IV BOLUS
INTRAVENOUS | Status: AC
  Filled 2014-08-01: qty 20

## 2014-08-01 MED ORDER — BUPIVACAINE-EPINEPHRINE 0.25% -1:200000 IJ SOLN
INTRAMUSCULAR | Status: DC | PRN
  Administered 2014-08-01: 30 mL

## 2014-08-01 MED ORDER — ROCURONIUM BROMIDE 50 MG/5ML IV SOLN
INTRAVENOUS | Status: AC
  Filled 2014-08-01: qty 1

## 2014-08-01 MED ORDER — FENTANYL CITRATE 0.05 MG/ML IJ SOLN
INTRAMUSCULAR | Status: DC | PRN
  Administered 2014-08-01 (×2): 100 ug via INTRAVENOUS

## 2014-08-01 MED ORDER — ACETAMINOPHEN 325 MG PO TABS: 650.0000 mg | ORAL_TABLET | ORAL | Status: DC | PRN

## 2014-08-01 MED ORDER — GLYCOPYRROLATE 0.2 MG/ML IJ SOLN
INTRAMUSCULAR | Status: AC
  Filled 2014-08-01: qty 2

## 2014-08-01 MED ORDER — PHENYLEPHRINE HCL 10 MG/ML IJ SOLN
INTRAMUSCULAR | Status: DC | PRN
  Administered 2014-08-01 (×3): 80 ug via INTRAVENOUS
  Administered 2014-08-01: 120 ug via INTRAVENOUS

## 2014-08-01 MED ORDER — MIDAZOLAM HCL 2 MG/2ML IJ SOLN
INTRAMUSCULAR | Status: AC
  Filled 2014-08-01: qty 2

## 2014-08-01 MED ORDER — BUPIVACAINE-EPINEPHRINE (PF) 0.25% -1:200000 IJ SOLN
INTRAMUSCULAR | Status: AC
  Filled 2014-08-01: qty 30

## 2014-08-01 MED ORDER — DEXAMETHASONE SODIUM PHOSPHATE 4 MG/ML IJ SOLN
INTRAMUSCULAR | Status: AC
  Filled 2014-08-01: qty 1

## 2014-08-01 MED ORDER — ACETAMINOPHEN 650 MG RE SUPP: 650.0000 mg | RECTAL | Status: DC | PRN

## 2014-08-01 MED ORDER — OXYCODONE HCL 5 MG PO TABS
ORAL_TABLET | ORAL | Status: AC
  Administered 2014-08-01: 5 mg via ORAL
  Filled 2014-08-01: qty 1

## 2014-08-01 MED ORDER — HYDROMORPHONE HCL 1 MG/ML IJ SOLN: 0.2500 mg | INTRAMUSCULAR | Status: DC | PRN

## 2014-08-01 MED ORDER — SODIUM CHLORIDE 0.9 % IV SOLN
INTRAVENOUS | Status: DC | PRN
  Administered 2014-08-01: 50 mL

## 2014-08-01 MED ORDER — PROPOFOL 10 MG/ML IV BOLUS
INTRAVENOUS | Status: DC | PRN
  Administered 2014-08-01: 150 mg via INTRAVENOUS

## 2014-08-01 MED ORDER — SODIUM CHLORIDE 0.9 % IJ SOLN: 3.0000 mL | INTRAMUSCULAR | Status: DC | PRN

## 2014-08-01 MED ORDER — LACTATED RINGERS IV SOLN
INTRAVENOUS | Status: DC
  Administered 2014-08-01 (×2): via INTRAVENOUS

## 2014-08-01 MED ORDER — OXYCODONE-ACETAMINOPHEN 5-325 MG PO TABS: 1.0000 | ORAL_TABLET | ORAL | Status: DC | PRN

## 2014-08-01 MED ORDER — CHLORHEXIDINE GLUCONATE 4 % EX LIQD: 1.0000 "application " | Freq: Once | CUTANEOUS | Status: DC

## 2014-08-01 MED ORDER — SCOPOLAMINE 1 MG/3DAYS TD PT72
MEDICATED_PATCH | TRANSDERMAL | Status: AC
  Administered 2014-08-01: 1.5 mg via TRANSDERMAL
  Filled 2014-08-01: qty 1

## 2014-08-01 MED ORDER — ONDANSETRON HCL 4 MG/2ML IJ SOLN
INTRAMUSCULAR | Status: DC | PRN
  Administered 2014-08-01: 4 mg via INTRAVENOUS

## 2014-08-01 MED ORDER — GLYCOPYRROLATE 0.2 MG/ML IJ SOLN
INTRAMUSCULAR | Status: DC | PRN
  Administered 2014-08-01: 0.4 mg via INTRAVENOUS

## 2014-08-01 MED ORDER — ROCURONIUM BROMIDE 100 MG/10ML IV SOLN
INTRAVENOUS | Status: DC | PRN
  Administered 2014-08-01: 40 mg via INTRAVENOUS

## 2014-08-01 MED ORDER — SODIUM CHLORIDE 0.9 % IJ SOLN: 3.0000 mL | Freq: Two times a day (BID) | INTRAMUSCULAR | Status: DC

## 2014-08-01 MED ORDER — PHENYLEPHRINE 40 MCG/ML (10ML) SYRINGE FOR IV PUSH (FOR BLOOD PRESSURE SUPPORT)
PREFILLED_SYRINGE | INTRAVENOUS | Status: AC
Start: 2014-08-01 — End: 2014-08-01
  Filled 2014-08-01: qty 10

## 2014-08-01 MED ORDER — FENTANYL CITRATE 0.05 MG/ML IJ SOLN
INTRAMUSCULAR | Status: AC
  Filled 2014-08-01: qty 5

## 2014-08-01 MED ORDER — 0.9 % SODIUM CHLORIDE (POUR BTL) OPTIME
TOPICAL | Status: DC | PRN
  Administered 2014-08-01: 1000 mL

## 2014-08-01 MED ORDER — DEXAMETHASONE SODIUM PHOSPHATE 4 MG/ML IJ SOLN
INTRAMUSCULAR | Status: DC | PRN
  Administered 2014-08-01: 4 mg via INTRAVENOUS

## 2014-08-01 MED ORDER — ONDANSETRON HCL 4 MG/2ML IJ SOLN
INTRAMUSCULAR | Status: AC
  Filled 2014-08-01: qty 2

## 2014-08-01 MED ORDER — MORPHINE SULFATE 2 MG/ML IJ SOLN: 2.0000 mg | INTRAMUSCULAR | Status: DC | PRN

## 2014-08-01 MED ORDER — MIDAZOLAM HCL 5 MG/5ML IJ SOLN
INTRAMUSCULAR | Status: DC | PRN
  Administered 2014-08-01: 2 mg via INTRAVENOUS

## 2014-08-01 MED ORDER — SCOPOLAMINE 1 MG/3DAYS TD PT72
1.0000 | MEDICATED_PATCH | Freq: Once | TRANSDERMAL | Status: DC
  Administered 2014-08-01: 1.5 mg via TRANSDERMAL

## 2014-08-01 MED ORDER — OXYCODONE HCL 5 MG/5ML PO SOLN: 5.0000 mg | Freq: Once | ORAL | Status: DC | PRN

## 2014-08-01 MED ORDER — KETOROLAC TROMETHAMINE 30 MG/ML IJ SOLN: 30.0000 mg | Freq: Four times a day (QID) | INTRAMUSCULAR | Status: DC

## 2014-08-01 MED ORDER — LIDOCAINE HCL (CARDIAC) 20 MG/ML IV SOLN
INTRAVENOUS | Status: DC | PRN
  Administered 2014-08-01: 80 mg via INTRAVENOUS

## 2014-08-01 MED ORDER — SODIUM CHLORIDE 0.9 % IR SOLN
Status: DC | PRN
  Administered 2014-08-01: 1000 mL

## 2014-08-01 MED ORDER — LIDOCAINE HCL (CARDIAC) 20 MG/ML IV SOLN
INTRAVENOUS | Status: AC
  Filled 2014-08-01: qty 5

## 2014-08-01 MED ORDER — OXYCODONE HCL 5 MG PO TABS
5.0000 mg | ORAL_TABLET | ORAL | Status: DC | PRN
  Administered 2014-08-01: 5 mg via ORAL

## 2014-08-01 MED ORDER — OXYCODONE HCL 5 MG PO TABS: 5.0000 mg | ORAL_TABLET | Freq: Once | ORAL | Status: DC | PRN

## 2014-08-01 MED ORDER — NEOSTIGMINE METHYLSULFATE 10 MG/10ML IV SOLN
INTRAVENOUS | Status: DC | PRN
  Administered 2014-08-01: 3 mg via INTRAVENOUS

## 2014-08-01 MED ORDER — SODIUM CHLORIDE 0.9 % IV SOLN: 250.0000 mL | INTRAVENOUS | Status: DC | PRN

## 2014-08-01 SURGICAL SUPPLY — 47 items
APPLIER CLIP ROT 10 11.4 M/L (STAPLE) ×2
BLADE SURG ROTATE 9660 (MISCELLANEOUS) IMPLANT
CANISTER SUCTION 2500CC (MISCELLANEOUS) ×2 IMPLANT
CHLORAPREP W/TINT 26ML (MISCELLANEOUS) ×2 IMPLANT
CLIP APPLIE ROT 10 11.4 M/L (STAPLE) ×1 IMPLANT
COVER MAYO STAND STRL (DRAPES) ×2 IMPLANT
COVER SURGICAL LIGHT HANDLE (MISCELLANEOUS) ×2 IMPLANT
DECANTER SPIKE VIAL GLASS SM (MISCELLANEOUS) ×4 IMPLANT
DERMABOND ADVANCED (GAUZE/BANDAGES/DRESSINGS) ×1
DERMABOND ADVANCED .7 DNX12 (GAUZE/BANDAGES/DRESSINGS) ×1 IMPLANT
DRAPE C-ARM 42X72 X-RAY (DRAPES) ×2 IMPLANT
DRAPE LAPAROSCOPIC ABDOMINAL (DRAPES) ×2 IMPLANT
DRAPE UTILITY 15X26 W/TAPE STR (DRAPE) ×8 IMPLANT
DRAPE WARM FLUID 44X44 (DRAPE) ×2 IMPLANT
ELECT REM PT RETURN 9FT ADLT (ELECTROSURGICAL) ×2
ELECTRODE REM PT RTRN 9FT ADLT (ELECTROSURGICAL) ×1 IMPLANT
GLOVE BIO SURGEON STRL SZ8 (GLOVE) ×2 IMPLANT
GLOVE BIOGEL PI IND STRL 6.5 (GLOVE) ×1 IMPLANT
GLOVE BIOGEL PI IND STRL 7.0 (GLOVE) ×1 IMPLANT
GLOVE BIOGEL PI IND STRL 8 (GLOVE) ×1 IMPLANT
GLOVE BIOGEL PI INDICATOR 6.5 (GLOVE) ×1
GLOVE BIOGEL PI INDICATOR 7.0 (GLOVE) ×1
GLOVE BIOGEL PI INDICATOR 8 (GLOVE) ×1
GLOVE ECLIPSE 7.5 STRL STRAW (GLOVE) ×2 IMPLANT
GLOVE SURG SS PI 7.0 STRL IVOR (GLOVE) ×2 IMPLANT
GOWN STRL REUS W/ TWL LRG LVL3 (GOWN DISPOSABLE) ×4 IMPLANT
GOWN STRL REUS W/ TWL XL LVL3 (GOWN DISPOSABLE) ×1 IMPLANT
GOWN STRL REUS W/TWL LRG LVL3 (GOWN DISPOSABLE) ×4
GOWN STRL REUS W/TWL XL LVL3 (GOWN DISPOSABLE) ×1
KIT BASIN OR (CUSTOM PROCEDURE TRAY) ×2 IMPLANT
KIT ROOM TURNOVER OR (KITS) ×2 IMPLANT
NS IRRIG 1000ML POUR BTL (IV SOLUTION) ×4 IMPLANT
PAD ARMBOARD 7.5X6 YLW CONV (MISCELLANEOUS) ×2 IMPLANT
POUCH SPECIMEN RETRIEVAL 10MM (ENDOMECHANICALS) ×2 IMPLANT
SCISSORS LAP 5X35 DISP (ENDOMECHANICALS) ×2 IMPLANT
SET CHOLANGIOGRAPH 5 50 .035 (SET/KITS/TRAYS/PACK) ×2 IMPLANT
SET IRRIG TUBING LAPAROSCOPIC (IRRIGATION / IRRIGATOR) ×2 IMPLANT
SLEEVE ENDOPATH XCEL 5M (ENDOMECHANICALS) ×2 IMPLANT
SPECIMEN JAR SMALL (MISCELLANEOUS) ×2 IMPLANT
SUT MNCRL AB 4-0 PS2 18 (SUTURE) ×2 IMPLANT
TOWEL OR 17X24 6PK STRL BLUE (TOWEL DISPOSABLE) ×2 IMPLANT
TOWEL OR 17X26 10 PK STRL BLUE (TOWEL DISPOSABLE) ×2 IMPLANT
TRAY LAPAROSCOPIC (CUSTOM PROCEDURE TRAY) ×2 IMPLANT
TROCAR XCEL BLUNT TIP 100MML (ENDOMECHANICALS) ×2 IMPLANT
TROCAR XCEL NON-BLD 11X100MML (ENDOMECHANICALS) ×2 IMPLANT
TROCAR XCEL NON-BLD 5MMX100MML (ENDOMECHANICALS) ×2 IMPLANT
TUBING INSUFFLATION (TUBING) ×2 IMPLANT

## 2014-08-01 NOTE — Transfer of Care (Signed)
Immediate Anesthesia Transfer of Care Note  Patient: Carrie Shelton  Procedure(s) Performed: Procedure(s): LAPAROSCOPIC CHOLECYSTECTOMY WITH INTRAOPERATIVE CHOLANGIOGRAM (N/A)  Patient Location: PACU  Anesthesia Type:General  Level of Consciousness: awake, alert , oriented and patient cooperative  Airway & Oxygen Therapy: Patient Spontanous Breathing and Patient connected to nasal cannula oxygen  Post-op Assessment: Report given to PACU RN, Post -op Vital signs reviewed and stable and Patient moving all extremities  Post vital signs: Reviewed and stable  Complications: No apparent anesthesia complications

## 2014-08-01 NOTE — Anesthesia Postprocedure Evaluation (Signed)
  Anesthesia Post-op Note  Patient: Carrie Shelton  Procedure(s) Performed: Procedure(s): LAPAROSCOPIC CHOLECYSTECTOMY WITH INTRAOPERATIVE CHOLANGIOGRAM (N/A)  Patient Location: PACU  Anesthesia Type:General  Level of Consciousness: awake, alert , oriented and patient cooperative  Airway and Oxygen Therapy: Patient Spontanous Breathing and Patient connected to nasal cannula oxygen  Post-op Pain: mild  Post-op Assessment: Post-op Vital signs reviewed, Patient's Cardiovascular Status Stable, Respiratory Function Stable, Patent Airway, No signs of Nausea or vomiting and Pain level controlled  Post-op Vital Signs: Reviewed and stable  Last Vitals:  Filed Vitals:   08/01/14 1345  BP:   Pulse:   Temp: 36.2 C  Resp:     Complications: No apparent anesthesia complications

## 2014-08-01 NOTE — Op Note (Signed)
Laparoscopic Cholecystectomy with IOC Procedure Note  Indications: This patient presents with symptomatic gallbladder disease and will undergo laparoscopic cholecystectomy.  Pre-operative Diagnosis: Calculus of gallbladder without mention of cholecystitis or obstruction  Post-operative Diagnosis: Same  Surgeon: Nahla Lukin A.   Assistants: OR staff  Anesthesia: General endotracheal anesthesia and Local anesthesia 0.25.% bupivacaine, with epinephrine  ASA Class: 2  Procedure Details  The patient was seen again in the Holding Room. The risks, benefits, complications, treatment options, and expected outcomes were discussed with the patient. The possibilities of reaction to medication, pulmonary aspiration, perforation of viscus, bleeding, recurrent infection, finding a normal gallbladder, the need for additional procedures, failure to diagnose a condition, the possible need to convert to an open procedure, and creating a complication requiring transfusion or operation were discussed with the patient. The patient and/or family concurred with the proposed plan, giving informed consent. The site of surgery properly noted/marked. The patient was taken to Operating Room, identified as [facility] and the procedure verified as Laparoscopic Cholecystectomy with Intraoperative Cholangiograms. A Time Out was held and the above information confirmed.  Prior to the induction of general anesthesia, antibiotic prophylaxis was administered. General endotracheal anesthesia was then administered and tolerated well. After the induction, the abdomen was prepped in the usual sterile fashion. The patient was positioned in the supine position with the left arm comfortably tucked, along with some reverse Trendelenburg.  Local anesthetic agent was injected into the skin near the umbilicus and an incision made. The midline fascia was incised and the Hasson technique was used to introduce a 12 mm port under direct  vision. It was secured with a figure of eight Vicryl suture placed in the usual fashion. Pneumoperitoneum was then created with CO2 and tolerated well without any adverse changes in the patient's vital signs. Additional trocars were introduced under direct vision with an 11 mm trocar in the epigastrium and two  5 mm trocars in the right upper quadrant. All skin incisions were infiltrated with a local anesthetic agent before making the incision and placing the trocars.   The gallbladder was identified, the fundus grasped and retracted cephalad. Adhesions were lysed bluntly and with the electrocautery where indicated, taking care not to injure any adjacent organs or viscus.  Colon adheased to right lobe of the liver and this was taken down sharply with care taken not to injure the transverse colon.  The infundibulum was grasped and retracted laterally, exposing the peritoneum overlying the triangle of Calot. This was then divided and exposed in a blunt fashion. The cystic duct was clearly identified and bluntly dissected circumferentially. The junctions of the gallbladder, cystic duct and common bile duct were clearly identified prior to the division of any linear structure.   An incision was made in the cystic duct and the cholangiogram catheter introduced. The catheter was secured using an endoclip. The study showed no stones and good visualization of the distal and proximal biliary tree. The catheter was then removed.   The cystic duct was then  ligated with surgical clips  on the patient side and  clipped on the gallbladder side and divided. The cystic artery was identified, dissected free, ligated with clips and divided as well. Posterior cystic artery clipped and divided.  The gallbladder was dissected from the liver bed in retrograde fashion with the electrocautery. The gallbladder was removed via bag through umbilicus.  The liver bed was irrigated and inspected. Hemostasis was achieved with the  electrocautery. Copious irrigation was utilized and was repeatedly  aspirated until clear all particulate matter. Hemostasis was achieved with no signs  Of bleeding or bile leakage.  Pneumoperitoneum was completely reduced after viewing removal of the trocars under direct vision. The wound was thoroughly irrigated and the fascia was then closed with a figure of eight suture; the skin was then closed with 4 O monocryl and a sterile dressing was applied.  Instrument, sponge, and needle counts were correct at closure and at the conclusion of the case.   Findings:  Cholelithiasis  Estimated Blood Loss: Minimal         Drains: none         Total IV Fluids:  600 mL         Specimens: Gallbladder           Complications: None; patient tolerated the procedure well.         Disposition: PACU - hemodynamically stable.         Condition: stable

## 2014-08-01 NOTE — Anesthesia Procedure Notes (Signed)
Procedure Name: Intubation Date/Time: 08/01/2014 10:57 AM Performed by: Julian Reil Pre-anesthesia Checklist: Patient identified, Emergency Drugs available, Suction available and Patient being monitored Patient Re-evaluated:Patient Re-evaluated prior to inductionOxygen Delivery Method: Circle system utilized Preoxygenation: Pre-oxygenation with 100% oxygen Intubation Type: IV induction Ventilation: Mask ventilation without difficulty Laryngoscope Size: Mac and 3 Grade View: Grade I Tube type: Oral Tube size: 7.0 mm Number of attempts: 1 Airway Equipment and Method: Stylet Placement Confirmation: ETT inserted through vocal cords under direct vision,  positive ETCO2 and breath sounds checked- equal and bilateral Secured at: 22 cm Tube secured with: Tape Dental Injury: Teeth and Oropharynx as per pre-operative assessment

## 2014-08-01 NOTE — H&P (Signed)
H&P   Carrie Shelton (MR# [id number])      H&P Info    Author Note Status Last Update User Last Update Date/Time   Carrie Luna, MD Signed Carrie Luna, MD 06/09/2014 4:02 PM    H&P    Expand All Collapse All    Carrie Shelton 06/09/2014 10:25 AM Location: [facility] Patient #: [id number] DOB: Jun 20, 1971 Single / Language: Hindi / Race: Refused to Report/Unreported Female History of Present Illness (Carrie Shelton A. [name] MD; 06/09/2014 3:59 PM) Patient words: Gallstones and pain Pt with 3 month hx of intermittent RUQ abdominal pain severe sharp lasting minutes to hours and intermittent. Not related to food. Seen in ED CT revealed gallstones and thickened gallbladder wall last month.  The patient is a 43 year old female who presents for evaluation of gall stones. The onset of the gall stones has been acute and has been occurring in an intermittent pattern for 3 months. The course has been gradually worsening. The gall stones is described as severe. There has been associated abdominal pain, anorexia and back pain. There were no precipitating factors. There are no relieving factors. Other Problems Carrie Shelton [place], [name]; 06/09/2014 10:25 AM) No pertinent past medical history  Past Surgical History (Carrie Shelton, [name]; 06/09/2014 10:25 AM) Resection of Stomach  Diagnostic Studies History (Carrie Shelton, [name]; 06/09/2014 10:25 AM) Pap Smear never  Allergies [name], [name]; 06/09/2014 10:31 AM) No Known Drug Allergies 06/08/2014  Medication History (Carrie [place], [name]; 06/09/2014 10:31 AM) No Current Medications  Social History (Carrie Shelton, [name]; 06/09/2014 10:25 AM) Caffeine use Tea. No alcohol use No drug use Tobacco use Never smoker.  Family History (Carrie Shelton, [name]; 06/09/2014 10:25 AM) First Degree Relatives No pertinent family history  Pregnancy / Birth History (Carrie Shelton, [name];  06/09/2014 10:25 AM) Age at menarche 75 years. Gravida 2 Maternal age 40-20 Para 2 Regular periods     Review of Systems ([name]; 06/09/2014 10:25 AM) General Not Present- Appetite Loss, Chills, Fatigue, Fever, Night Sweats, Weight Gain and Weight Loss. Skin Not Present- Change in Wart/Mole, Dryness, Hives, Jaundice, New Lesions, Non-Healing Wounds, Rash and Ulcer. HEENT Not Present- Earache, Hearing Loss, Hoarseness, Nose Bleed, Oral Ulcers, Ringing in the Ears, Seasonal Allergies, Sinus Pain, Sore Throat, Visual Disturbances, Wears glasses/contact lenses and Yellow Eyes. Respiratory Not Present- Bloody sputum, Chronic Cough, Difficulty Breathing, Snoring and Wheezing. Breast Not Present- Breast Mass, Breast Pain, Nipple Discharge and Skin Changes. Cardiovascular Not Present- Chest Pain, Difficulty Breathing Lying Down, Leg Cramps, Palpitations, Rapid Heart Rate, Shortness of Breath and Swelling of Extremities. Gastrointestinal Not Present- Abdominal Pain, Bloating, Bloody Stool, Change in Bowel Habits, Chronic diarrhea, Constipation, Difficulty Swallowing, Excessive gas, Gets full quickly at meals, Hemorrhoids, Indigestion, Nausea, Rectal Pain and Vomiting. Female Genitourinary Not Present- Frequency, Nocturia, Painful Urination, Pelvic Pain and Urgency. Musculoskeletal Not Present- Back Pain, Joint Pain, Joint Stiffness, Muscle Pain, Muscle Weakness and Swelling of Extremities. Neurological Not Present- Decreased Memory, Fainting, Headaches, Numbness, Seizures, Tingling, Tremor, Trouble walking and Weakness. Psychiatric Not Present- Anxiety, Bipolar, Change in Sleep Pattern, Depression, Fearful and Frequent crying. Endocrine Not Present- Cold Intolerance, Excessive Hunger, Hair Changes, Heat Intolerance, Hot flashes and New Diabetes. Hematology Not Present- Easy Bruising, Excessive bleeding, Gland problems, HIV and Persistent Infections.  Vitals (Carrie  Shelton [name]; 06/09/2014 10:28 AM) 06/09/2014 10:25 AM Weight: 167.4 lb Height: 61in Body Surface Area: 1.81 m Body Mass Index: 31.63 kg/m Temp.: 98.52F(Oral)  Pulse: 64 (Regular)  P.OX: 97% (Room air) BP:  98/60 (Sitting, Left Arm, Standard)     Physical Exam (Carrie Shelton A. Carrie Cowher MD; 06/09/2014 4:00 PM)  General Mental Status-Alert. General Appearance-Consistent with stated age. Hydration-Well hydrated. Voice-Normal.  Head and Neck Head-normocephalic, atraumatic with no lesions or palpable masses. Trachea-midline. Thyroid Gland Characteristics - normal size and consistency.  Eye Eyeball - Bilateral-Extraocular movements intact. Sclera/Conjunctiva - Bilateral-No scleral icterus.  Chest and Lung Exam Chest and lung exam reveals -quiet, even and easy respiratory effort with no use of accessory muscles, normal resonance, no flatness or dullness, non-tender and normal tactile fremitus and on auscultation, normal breath sounds, no adventitious sounds and normal vocal resonance. Inspection Chest Wall - Normal. Back - normal.  Breast Breast - Left-Symmetric, Non Tender, No Biopsy scars, no Dimpling, No Inflammation, No Lumpectomy scars, No Mastectomy scars, No Peau d' Orange. Breast - Right-Symmetric, Non Tender, No Biopsy scars, no Dimpling, No Inflammation, No Lumpectomy scars, No Mastectomy scars, No Peau d' Orange. Breast Lump-No Palpable Breast Mass.  Cardiovascular Cardiovascular examination reveals -on palpation PMI is normal in location and amplitude, no palpable S3 or S4. Normal cardiac borders., normal heart sounds, regular rate and rhythm with no murmurs, carotid auscultation reveals no bruits and normal pedal pulses bilaterally.  Abdomen Inspection Inspection of the abdomen reveals - No Hernias. Skin - Scar - no surgical scars. Palpation/Percussion Palpation and Percussion of the abdomen reveal - Soft, Non Tender, No Rebound  tenderness, No Rigidity (guarding) and No hepatosplenomegaly. Auscultation Auscultation of the abdomen reveals - Bowel sounds normal.  Rectal - Did not examine.  Peripheral Vascular Upper Extremity Inspection - Bilateral - Normal - No Clubbing, No Cyanosis, No Edema, Pulses Intact. Palpation - Pulses bilaterally normal. Lower Extremity Palpation - Pulses bilaterally normal.  Neurologic Neurologic evaluation reveals -alert and oriented x 3 with no impairment of recent or remote memory. Mental Status-Normal.  Musculoskeletal Normal Exam - Left-Upper Extremity Strength Normal and Lower Extremity Strength Normal. Normal Exam - Right-Upper Extremity Strength Normal, Lower Extremity Weakness.  Lymphatic Head & Neck  General Head & Neck Lymphatics: Bilateral - Description - Normal. Axillary  General Axillary Region: Bilateral - Description - Normal. Tenderness - Non Tender. Femoral & Inguinal  Generalized Femoral & Inguinal Lymphatics: Bilateral - Description - Normal. Tenderness - Non Tender.    Assessment & Plan ([place]ava[place] Quintela A. Tiyana Galla MD; 06/09/2014 11:22 AM)  RECURRENT BILIARY COLIC (505.39  J67.34)  Current Plans LAPAROSCOPIC CHOLECYSTECTOMY W CHOLANGIOGRAPHY [phone]) Pt Education - Laparoscopic Cholecystectomy: cholecystectomy

## 2014-08-01 NOTE — Progress Notes (Signed)
Calling for interpreter. Not sure if pt understands

## 2014-08-01 NOTE — Anesthesia Preprocedure Evaluation (Addendum)
Anesthesia Evaluation  Patient identified by MRN, date of birth, ID band Patient awake    Reviewed: Allergy & Precautions, H&P , NPO status , Patient's Chart, lab work & pertinent test results  Airway Mallampati: II  TM Distance: >3 FB Neck ROM: Full    Dental no notable dental hx. (+) Teeth Intact, Dental Advisory Given   Pulmonary neg pulmonary ROS,  breath sounds clear to auscultation  Pulmonary exam normal       Cardiovascular negative cardio ROS  Rhythm:Regular Rate:Normal     Neuro/Psych negative neurological ROS  negative psych ROS   GI/Hepatic negative GI ROS, Neg liver ROS,   Endo/Other  negative endocrine ROS  Renal/GU negative Renal ROS  negative genitourinary   Musculoskeletal   Abdominal   Peds  Hematology negative hematology ROS (+) anemia ,   Anesthesia Other Findings   Reproductive/Obstetrics negative OB ROS                            Anesthesia Physical Anesthesia Plan  ASA: II  Anesthesia Plan: General   Post-op Pain Management:    Induction: Intravenous  Airway Management Planned: Oral ETT  Additional Equipment:   Intra-op Plan:   Post-operative Plan: Extubation in OR  Informed Consent: I have reviewed the patients History and Physical, chart, labs and discussed the procedure including the risks, benefits and alternatives for the proposed anesthesia with the patient or authorized representative who has indicated his/her understanding and acceptance.   Dental advisory given  Plan Discussed with: CRNA  Anesthesia Plan Comments:         Anesthesia Quick Evaluation

## 2014-08-01 NOTE — Progress Notes (Signed)
Called Dr.Jackson for sign out  

## 2014-08-01 NOTE — Progress Notes (Signed)
Receiving report from [facility] RN at this time

## 2014-08-01 NOTE — Discharge Instructions (Signed)
[facility] ______[facility] °LAPAROSCOPIC SURGERY: POST OP INSTRUCTIONS °Always review your discharge instruction sheet given to you by the facility where your surgery was performed. °IF YOU HAVE DISABILITY OR FAMILY LEAVE FORMS, YOU MUST BRING THEM TO THE OFFICE FOR PROCESSING.   °DO NOT GIVE THEM TO YOUR DOCTOR. ° °1. A prescription for pain medication may be given to you upon discharge.  Take your pain medication as prescribed, if needed.  If narcotic pain medicine is not needed, then you may take acetaminophen (Tylenol) or ibuprofen (Advil) as needed. °2. Take your usually prescribed medications unless otherwise directed. °3. If you need a refill on your pain medication, please contact your pharmacy.  They will contact our office to request authorization. Prescriptions will not be filled after 5pm or on week-ends. °4. You should follow a light diet the first few days after arrival home, such as soup and crackers, etc.  Be sure to include lots of fluids daily. °5. Most patients will experience some swelling and bruising in the area of the incisions.  Ice packs will help.  Swelling and bruising can take several days to resolve.  °6. It is common to experience some constipation if taking pain medication after surgery.  Increasing fluid intake and taking a stool softener (such as Colace) will usually help or prevent this problem from occurring.  A mild laxative (Milk of Magnesia or Miralax) should be taken according to package instructions if there are no bowel movements after 48 hours. °7. Unless discharge instructions indicate otherwise, you may remove your bandages 24-48 hours after surgery, and you may shower at that time.  You may have steri-strips (small skin tapes) in place directly over the incision.  These strips should be left on the skin for 7-10 days.  If your surgeon used skin glue on the incision, you may shower in 24 hours.  The glue will flake off over the next 2-3 weeks.  Any sutures or  staples will be removed at the office during your follow-up visit. °8. ACTIVITIES:  You may resume regular (light) daily activities beginning the next day--such as daily self-care, walking, climbing stairs--gradually increasing activities as tolerated.  You may have sexual intercourse when it is comfortable.  Refrain from any heavy lifting or straining until approved by your doctor. °a. You may drive when you are no longer taking prescription pain medication, you can comfortably wear a seatbelt, and you can safely maneuver your car and apply brakes. °b. RETURN TO WORK:  __________________________________________________________ °9. You should see your doctor in the office for a follow-up appointment approximately 2-3 weeks after your surgery.  Make sure that you call for this appointment within a day or two after you arrive home to insure a convenient appointment time. °10. OTHER INSTRUCTIONS: __________________________________________________________________________________________________________________________ __________________________________________________________________________________________________________________________ °WHEN TO CALL YOUR DOCTOR: °1. Fever over 101.0 °2. Inability to urinate °3. Continued bleeding from incision. °4. Increased pain, redness, or drainage from the incision. °5. Increasing abdominal pain ° °The clinic staff is available to answer your questions during regular business hours.  Please don’t hesitate to call and ask to speak to one of the nurses for clinical concerns.  If you have a medical emergency, go to the nearest emergency room or call 911.  A surgeon from [facility] is always on call at the hospital. °[address], [address], [place], [place]  [address] ? [address] [address], [place],    [address] °[phone] ? [phone] ? FAX [phone] °Web site:   [email or web address]  General Anesthesia, Adult, Care After  Refer to this  sheet in the next few weeks. These instructions provide you with information on caring for yourself after your procedure. Your health care provider may also give you more specific instructions. Your treatment has been planned according to current medical practices, but problems sometimes occur. Call your health care provider if you have any problems or questions after your procedure.  WHAT TO EXPECT AFTER THE PROCEDURE  After the procedure, it is typical to experience:  Sleepiness.  Nausea and vomiting. HOME CARE INSTRUCTIONS  For the first 24 hours after general anesthesia:  Have a responsible person with you.  Do not drive a car. If you are alone, do not take public transportation.  Do not drink alcohol.  Do not take medicine that has not been prescribed by your health care provider.  Do not sign important papers or make important decisions.  You may resume a normal diet and activities as directed by your health care provider.  Change bandages (dressings) as directed.  If you have questions or problems that seem related to general anesthesia, call the hospital and ask for the anesthetist or anesthesiologist on call. SEEK MEDICAL CARE IF:  You have nausea and vomiting that continue the day after anesthesia.  You develop a rash. SEEK IMMEDIATE MEDICAL CARE IF:  You have difficulty breathing.  You have chest pain.  You have any allergic problems. Document Released: 12/15/2000 Document Revised: 05/11/2013 Document Reviewed: 03/24/2013  [facility] Patient Information 2014 [place], [place].

## 2014-08-01 NOTE — Progress Notes (Signed)
Pt up to bathroom x2 tolerated well, tolerating PO's well. Will continue to monitor

## 2014-08-01 NOTE — Progress Notes (Signed)
Pt has ambulated several times to bathroom, has many family members with her but they are waiting for the family member that will drive her home, pt is ready for discharge, pain is tolerable, no N/V.

## 2014-08-01 NOTE — Progress Notes (Signed)
Pt states she has a little discomfort per daughter but not enough that she wants pain med and will let me know if that changes.

## 2014-08-01 NOTE — Interval H&P Note (Signed)
History and Physical Interval Note:  08/01/2014 10:25 AM  Carrie Shelton  has presented today for surgery, with the diagnosis of gallstones  The various methods of treatment have been discussed with the patient and family. After consideration of risks, benefits and other options for treatment, the patient has consented to  Procedure(s): LAPAROSCOPIC CHOLECYSTECTOMY WITH INTRAOPERATIVE CHOLANGIOGRAM (N/A) as a surgical intervention .  The patient's history has been reviewed, patient examined, no change in status, stable for surgery.  I have reviewed the patient's chart and labs.  Questions were answered to the patient's satisfaction.     Christia Coaxum A.

## 2014-08-02 ENCOUNTER — Encounter (HOSPITAL_COMMUNITY): Payer: Self-pay | Admitting: Surgery

## 2014-11-27 ENCOUNTER — Encounter: Payer: Self-pay | Admitting: Diagnostic Neuroimaging

## 2014-11-27 ENCOUNTER — Ambulatory Visit (INDEPENDENT_AMBULATORY_CARE_PROVIDER_SITE_OTHER): Payer: No Typology Code available for payment source | Admitting: Diagnostic Neuroimaging

## 2014-11-27 VITALS — BP 96/64 | HR 82 | Ht 60.0 in | Wt 168.4 lb

## 2014-11-27 DIAGNOSIS — G43009 Migraine without aura, not intractable, without status migrainosus: Secondary | ICD-10-CM

## 2014-11-27 DIAGNOSIS — R269 Unspecified abnormalities of gait and mobility: Secondary | ICD-10-CM

## 2014-11-27 DIAGNOSIS — R42 Dizziness and giddiness: Secondary | ICD-10-CM

## 2014-11-27 NOTE — Progress Notes (Signed)
[facility]  PATIENT: Carrie Shelton DOB: 1970-11-19  REFERRING CLINICIAN: [place] HISTORY FROM: patient and daughter (who translates) REASON FOR VISIT: new consult   HISTORICAL  CHIEF COMPLAINT:  Chief Complaint  Patient presents with  . New Evaluation    been having a headache pretty much constantly for the past two years     HISTORY OF PRESENT ILLNESS:   44 year old left-handed female here for evaluation of dizziness. Patient has had problems with dizziness for at least 17 years. [place] she had intermittent episodes of room spinning sensation, balance difficulty, on and off for a few days at a time. Symptoms last most of the month. Sometimes she would have nausea and vomiting with episodes. Sometimes she would've headache, bilateral temporal, throbbing sensation with it. Symptoms have spontaneously improved for many years until 3 years ago when she moved to [place]. Apparently there has been significant psychosocial stressors which led to her moving to the [place] with her 2 daughters. 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 Now her 2 daughters are having to work to support the family. This causes additional stress on the patient.  Patient has been evaluated by PCP now referred to me for further evaluation.   REVIEW OF SYSTEMS: Full 14 system review of systems performed and notable only for too much sleep decreased energy dizziness headache feeling hot feeling cold flushing joint pain cramps ringing in ears fevers.  ALLERGIES: No Known Allergies  HOME MEDICATIONS: Outpatient Prescriptions Prior to Visit  Medication Sig Dispense Refill  . oxyCODONE-acetaminophen (ROXICET) 5-325 MG per tablet Take 1-2 tablets by mouth every 4 (four) hours as needed. (Patient not taking: Reported on 11/27/2014) 30 tablet 0   No facility-administered medications prior to  visit.    PAST MEDICAL HISTORY: Past Medical History  Diagnosis Date  . Vertigo   . Anemia     PAST SURGICAL HISTORY: Past Surgical History  Procedure Laterality Date  . Dilation and curettage of uterus      daughter is not really sure this is the only word she could remember  . Cholecystectomy N/A 08/01/2014    Procedure: LAPAROSCOPIC CHOLECYSTECTOMY WITH INTRAOPERATIVE CHOLANGIOGRAM;  Surgeon: Erroll Luna, MD;  Location: [facility];  Service: General;  Laterality: N/A;    FAMILY HISTORY: History reviewed. No pertinent family history.  SOCIAL HISTORY:  History   Social History  . Marital Status: Single    Spouse Name: N/A  . Number of Children: 2  . Years of Education: elementary   Occupational History  . student     Social History Main Topics  . Smoking status: Never Smoker   . Smokeless tobacco: Never Used  . Alcohol Use: No  . Drug Use: No  . Sexual Activity: No   Other Topics Concern  . Not on file   Social History Narrative   Lives with daughters   Drinks 1 cup of tea a day     PHYSICAL EXAM  Filed Vitals:   11/27/14 1107  BP: 96/64  Pulse: 82  Height: 5' (1.524 m)  Weight: 168 lb 6.4 oz (76.386 kg)    Body mass index is 32.89 kg/(m^2).  No exam data present  No flowsheet data found.  GENERAL EXAM: Patient is in no distress; well developed, nourished and groomed; neck is supple  CARDIOVASCULAR: Regular rate and rhythm, no murmurs, no carotid bruits  NEUROLOGIC: MENTAL STATUS:  awake, alert, oriented to person, place and time, recent and remote memory intact, normal attention and concentration, language fluent, comprehension intact, naming intact, fund of knowledge appropriate CRANIAL NERVE: no papilledema on fundoscopic exam, pupils equal and reactive to light, visual fields full to confrontation, extraocular muscles intact, no nystagmus, facial sensation and strength symmetric, hearing intact, palate elevates symmetrically, uvula midline,  shoulder shrug symmetric, tongue midline. MOTOR: normal bulk and tone, full strength in the BUE, BLE SENSORY: normal and symmetric to light touch, pinprick, temperature, vibration  COORDINATION: finger-nose-finger, fine finger movements normal REFLEXES: deep tendon reflexes present and symmetric GAIT/STATION: narrow based gait; able to walk tandem; romberg is negative    DIAGNOSTIC DATA (LABS, IMAGING, TESTING) - I reviewed patient records, labs, notes, testing and imaging myself where available.  Lab Results  Component Value Date   WBC 5.5 07/26/2014   HGB 12.1 07/26/2014   HCT 37.4 07/26/2014   MCV 83.1 07/26/2014   PLT 276 07/26/2014      Component Value Date/Time   NA 138 07/26/2014 1309   K 3.9 07/26/2014 1309   CL 103 07/26/2014 1309   CO2 26 07/26/2014 1309   GLUCOSE 98 07/26/2014 1309   BUN 10 07/26/2014 1309   CREATININE 0.54 07/26/2014 1309   CALCIUM 8.8 07/26/2014 1309   PROT 7.1 07/26/2014 1309   ALBUMIN 3.3* 07/26/2014 1309   AST 17 07/26/2014 1309   ALT 13 07/26/2014 1309   ALKPHOS 73 07/26/2014 1309   BILITOT 0.2* 07/26/2014 1309   GFRNONAA >90 07/26/2014 1309   GFRAA >90 07/26/2014 1309   No results found for: CHOL, HDL, LDLCALC, LDLDIRECT, TRIG, CHOLHDL No results found for: HGBA1C No results found for: VITAMINB12 No results found for: TSH  I reviewed images myself and agree with interpretation. -VRP  02/04/13 MRI BRAIN - normal     ASSESSMENT AND PLAN  44 y.o. year old female here with dizziness since 1999, intermittent, but worsening in the last 3 years. Patient has normal neurologic exam and normal MRI of the brain. May resent migraine phenomenon versus conversion reaction/stress reaction. Benign positional vertigo, labyrinthitis or other inner ear pathology is a consideration.  Ddx: migraine variant vs conversion reaction vs metabolic  PLAN: - lab testing - PT evaluation/treatment - monitor stress levels; consider psychiatry/psychology  evaluation for stress/depression - may consider empiric topiramate for migraine phenomenon  Orders Placed This Encounter  Procedures  . Vitamin B12  . TSH  . Hemoglobin A1c  . PT vestibular rehab   Return in about 3 months (around 02/27/2015).    Penni Bombard, MD 09/26/4006, 67:61 AM Certified in Neurology, Neurophysiology and Neuroimaging  [facility] [address] [place], [place] [address] [phone]

## 2014-11-27 NOTE — Patient Instructions (Signed)
Monitor symptoms.  I will check lab testing.  Try physical therapy / vestibular training.

## 2014-11-28 LAB — HEMOGLOBIN A1C
Est. average glucose Bld gHb Est-mCnc: 123 mg/dL
Hgb A1c MFr Bld: 5.9 % — ABNORMAL HIGH (ref 4.8–5.6)

## 2014-11-28 LAB — VITAMIN B12: Vitamin B-12: 501 pg/mL (ref 211–946)

## 2014-11-28 LAB — TSH: TSH: 0.921 u[IU]/mL (ref 0.450–4.500)

## 2014-11-30 ENCOUNTER — Encounter: Payer: Self-pay | Admitting: *Deleted

## 2015-01-12 ENCOUNTER — Ambulatory Visit: Payer: Self-pay | Admitting: Surgery

## 2015-01-12 NOTE — H&P (Signed)
Carrie Shelton 01/12/2015 11:30 AM Location: [facility] Patient #: [id number] DOB: Jul 30, 1971 Single / Language: Hindi / Race: Refused to Report/Unreported Female History of Present Illness (Carrie Shelton A. Dontray Haberland MD; 01/12/2015 1:07 PM) Patient words: lipoma on back   pt has a painful mass just above tailbone area for a number of months. it is getting bigger and causing pain especially when she sits. No redness or drainage.  The patient is a 44 year old female   Allergies Elbert Ewings, CMA; 01/12/2015 11:30 AM) No Known Drug Allergies 06/08/2014  Medication History Elbert Ewings, CMA; 01/12/2015 11:30 AM) Oxycodone-Acetaminophen (5-325MG  Tablet, Oral as needed) Active. Indomethacin (25MG  Capsule, Oral) Active. Meloxicam (15MG  Tablet, Oral) Active.    Vitals Elbert Ewings CMA; 01/12/2015 11:31 AM) 01/12/2015 11:31 AM Weight: 169 lb Height: 60in Body Surface Area: 1.8 m Body Mass Index: 33.01 kg/m Temp.: 98.29F(Oral)  Pulse: 70 (Regular)  Resp.: 17 (Unlabored)  BP: 128/70 (Sitting, Left Arm, Standard)     Physical Exam (Eathen Budreau A. Laila Myhre MD; 01/12/2015 1:09 PM)  General Mental Status-Alert. General Appearance-Consistent with stated age. Hydration-Well hydrated. Voice-Normal.  Integumentary Note: 4 cm x 4 cm fatty mobile mass over coccyx not fixed probable lipoma   Head and Neck Head-normocephalic, atraumatic with no lesions or palpable masses.  Cardiovascular Cardiovascular examination reveals -on palpation PMI is normal in location and amplitude, no palpable S3 or S4. Normal cardiac borders., normal heart sounds, regular rate and rhythm with no murmurs, carotid auscultation reveals no bruits and normal pedal pulses bilaterally.  Abdomen Note: lap cholecystectomy scars   Musculoskeletal Normal Exam - Left-Upper Extremity Strength Normal and Lower Extremity Strength Normal. Normal Exam - Right-Upper Extremity Strength  Normal, Lower Extremity Weakness.    Assessment & Plan (Jerzy Roepke A. Alanii Ramer MD; 01/12/2015 11:49 AM)  LIPOMA OF BACK (214.8  D17.1) Impression: pt desires excision due to pain. risk of bleeding infection and recurrence. Likelyhood this will help pain is 80 %. she agrees to proceed.  Current Plans Pt Education - [facility] (Gross) Pt Education - instructions

## 2015-02-26 ENCOUNTER — Ambulatory Visit (INDEPENDENT_AMBULATORY_CARE_PROVIDER_SITE_OTHER): Payer: Medicaid Other | Admitting: Diagnostic Neuroimaging

## 2015-02-26 ENCOUNTER — Encounter: Payer: Self-pay | Admitting: Diagnostic Neuroimaging

## 2015-02-26 VITALS — BP 112/70 | HR 65 | Ht 60.0 in | Wt 170.2 lb

## 2015-02-26 DIAGNOSIS — R269 Unspecified abnormalities of gait and mobility: Secondary | ICD-10-CM | POA: Diagnosis not present

## 2015-02-26 DIAGNOSIS — G43009 Migraine without aura, not intractable, without status migrainosus: Secondary | ICD-10-CM | POA: Diagnosis not present

## 2015-02-26 DIAGNOSIS — R42 Dizziness and giddiness: Secondary | ICD-10-CM | POA: Diagnosis not present

## 2015-02-26 MED ORDER — AMITRIPTYLINE HCL 25 MG PO TABS: 25.0000 mg | ORAL_TABLET | Freq: Every day | ORAL | Status: DC

## 2015-02-26 NOTE — Patient Instructions (Signed)
Start amitriptyline 25mg  at bedtime.

## 2015-02-26 NOTE — Progress Notes (Signed)
[facility]  PATIENT: Carrie Shelton DOB: Mar 23, 1971  REFERRING CLINICIAN: Lonergan HISTORY FROM: patient and daughter (who translates) REASON FOR VISIT: FOLLOW UP   HISTORICAL  CHIEF COMPLAINT:  Chief Complaint  Patient presents with  . Follow-up    vertigo    HISTORY OF PRESENT ILLNESS:   UPDATE 02/26/15: Since last visit, sxs are stable. Still with dizziness, HA. Stress and depression better.   PRIOR HPI (11/27/14): 44 year old left-handed female here for evaluation of dizziness. Patient has had problems with dizziness for at least 17 years. [place], she had intermittent episodes of room spinning sensation, balance difficulty, on and off for a few days at a time. Symptoms last most of the month. Sometimes she would have nausea and vomiting with episodes. Sometimes she would've headache, bilateral temporal, throbbing sensation with it. Symptoms have spontaneously improved for many years until 3 years ago when she moved to [place]. Apparently there has been significant psychosocial stressors which led to her moving to the [place] with her 2 daughters. 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 Now her 2 daughters are having to work to support the family. This causes additional stress on the patient. Patient has been evaluated by PCP now referred to me for further evaluation.   REVIEW OF SYSTEMS: Full 14 system review of systems performed and notable only for fever eye itching eye pain flushing weakness dizziness headache.    ALLERGIES: No Known Allergies  HOME MEDICATIONS: Outpatient Prescriptions Prior to Visit  Medication Sig Dispense Refill  . indomethacin (INDOCIN) 25 MG capsule   0  . meloxicam (MOBIC) 15 MG tablet   2  . oxyCODONE-acetaminophen (ROXICET) 5-325 MG per tablet Take 1-2 tablets by mouth every 4 (four) hours as needed. 30 tablet 0   No  facility-administered medications prior to visit.    PAST MEDICAL HISTORY: Past Medical History  Diagnosis Date  . Vertigo   . Anemia     PAST SURGICAL HISTORY: Past Surgical History  Procedure Laterality Date  . Dilation and curettage of uterus      daughter is not really sure this is the only word she could remember  . Cholecystectomy N/A 08/01/2014    Procedure: LAPAROSCOPIC CHOLECYSTECTOMY WITH INTRAOPERATIVE CHOLANGIOGRAM;  Surgeon: Erroll Luna, MD;  Location: [facility];  Service: General;  Laterality: N/A;    FAMILY HISTORY: No family history on file.  SOCIAL HISTORY:  History   Social History  . Marital Status: Single    Spouse Name: N/A  . Number of Children: 2  . Years of Education: elementary   Occupational History  . student     Social History Main Topics  . Smoking status: Never Smoker   . Smokeless tobacco: Never Used  . Alcohol Use: No  . Drug Use: No  . Sexual Activity: No   Other Topics Concern  . Not on file   Social History Narrative   Lives with daughters   Drinks 1 cup of tea a day     PHYSICAL EXAM  Filed Vitals:   02/26/15 1113  BP: 112/70  Pulse: 65  Height: 5' (1.524 m)  Weight: 170 lb 3.2 oz (77.202 kg)    Body mass index is 33.24 kg/(m^2).  No exam data present  No flowsheet data found.  GENERAL EXAM: Patient is in no distress; well developed, nourished and groomed; neck is supple: FLAT AFFECT  CARDIOVASCULAR: Regular rate and rhythm, no murmurs, no carotid bruits  NEUROLOGIC: MENTAL STATUS: awake, alert, anguage fluent, comprehension intact, naming intact, fund of knowledge appropriate CRANIAL NERVE: pupils equal and reactive to light, visual fields full to confrontation, extraocular muscles intact, no nystagmus, facial sensation and strength symmetric, hearing intact, palate elevates symmetrically, uvula midline, shoulder shrug symmetric, tongue midline. MOTOR: normal bulk and tone, full strength in the BUE,  BLE SENSORY: normal and symmetric to light touch  COORDINATION: finger-nose-finger, fine finger movements normal REFLEXES: deep tendon reflexes present and symmetric GAIT/STATION: narrow based gait; able to walk tandem; romberg is negative    DIAGNOSTIC DATA (LABS, IMAGING, TESTING) - I reviewed patient records, labs, notes, testing and imaging myself where available.  Lab Results  Component Value Date   WBC 5.5 07/26/2014   HGB 12.1 07/26/2014   HCT 37.4 07/26/2014   MCV 83.1 07/26/2014   PLT 276 07/26/2014      Component Value Date/Time   NA 138 07/26/2014 1309   K 3.9 07/26/2014 1309   CL 103 07/26/2014 1309   CO2 26 07/26/2014 1309   GLUCOSE 98 07/26/2014 1309   BUN 10 07/26/2014 1309   CREATININE 0.54 07/26/2014 1309   CALCIUM 8.8 07/26/2014 1309   PROT 7.1 07/26/2014 1309   ALBUMIN 3.3* 07/26/2014 1309   AST 17 07/26/2014 1309   ALT 13 07/26/2014 1309   ALKPHOS 73 07/26/2014 1309   BILITOT 0.2* 07/26/2014 1309   GFRNONAA >90 07/26/2014 1309   GFRAA >90 07/26/2014 1309   No results found for: CHOL, HDL, LDLCALC, LDLDIRECT, TRIG, CHOLHDL Lab Results  Component Value Date   HGBA1C 5.9* 11/27/2014   Lab Results  Component Value Date   VITAMINB12 501 11/27/2014   Lab Results  Component Value Date   TSH 0.921 11/27/2014    I reviewed images myself and agree with interpretation. -VRP  02/04/13 MRI BRAIN - normal     ASSESSMENT AND PLAN  44 y.o. year old female here with dizziness since 1999, intermittent, but worsening in the last 3 years. Patient has normal neurologic exam and normal MRI of the brain. May resent migraine phenomenon versus conversion reaction/stress reaction. Benign positional vertigo, labyrinthitis or other inner ear pathology is a consideration.  Ddx: migraine variant vs conversion reaction  PLAN: - monitor stress levels; consider psychiatry/psychology evaluation for stress/depression - PT evaluation for dizziness evaluation - will  try amitriptyline 25mg  at bedtime for migraine prevention  Meds ordered this encounter  Medications  . amitriptyline (ELAVIL) 25 MG tablet    Sig: Take 1 tablet (25 mg total) by mouth at bedtime.    Dispense:  30 tablet    Refill:  6   Return in about 3 months (around 05/29/2015).    Penni Bombard, MD 02/23/6802, 21:22 PM Certified in Neurology, Neurophysiology and Neuroimaging  [facility] [address] [place], [place] [address] [phone]

## 2015-05-27 IMAGING — CR DG CHEST 2V
2 series · 2 of 2 positions shown · non-contrast
Comparison: None.

CLINICAL DATA: No injury. Painful right shoulder. Right shoulder/
arm, lateral neck pain. Anterior right upper chest pain.

EXAM:
CHEST  2 VIEW

[w chest pa]
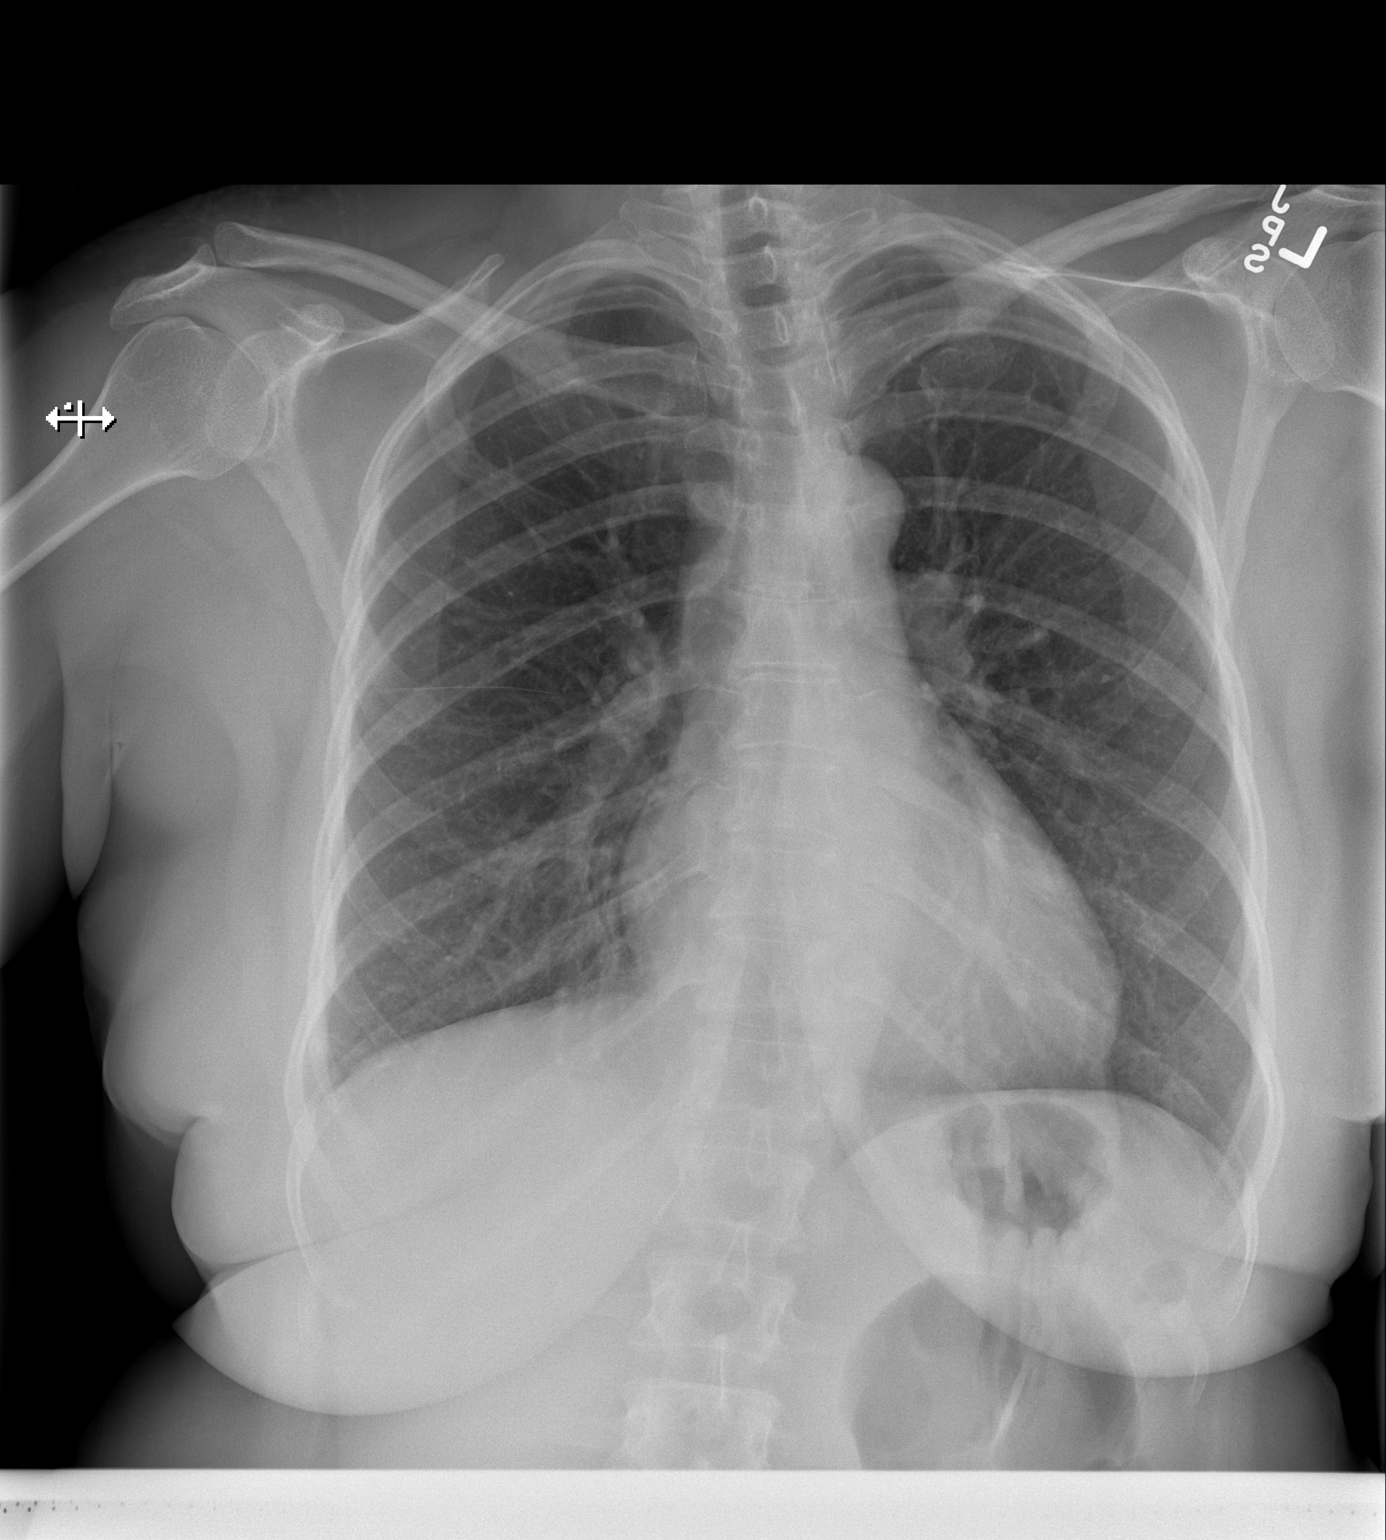

[w chest lat]
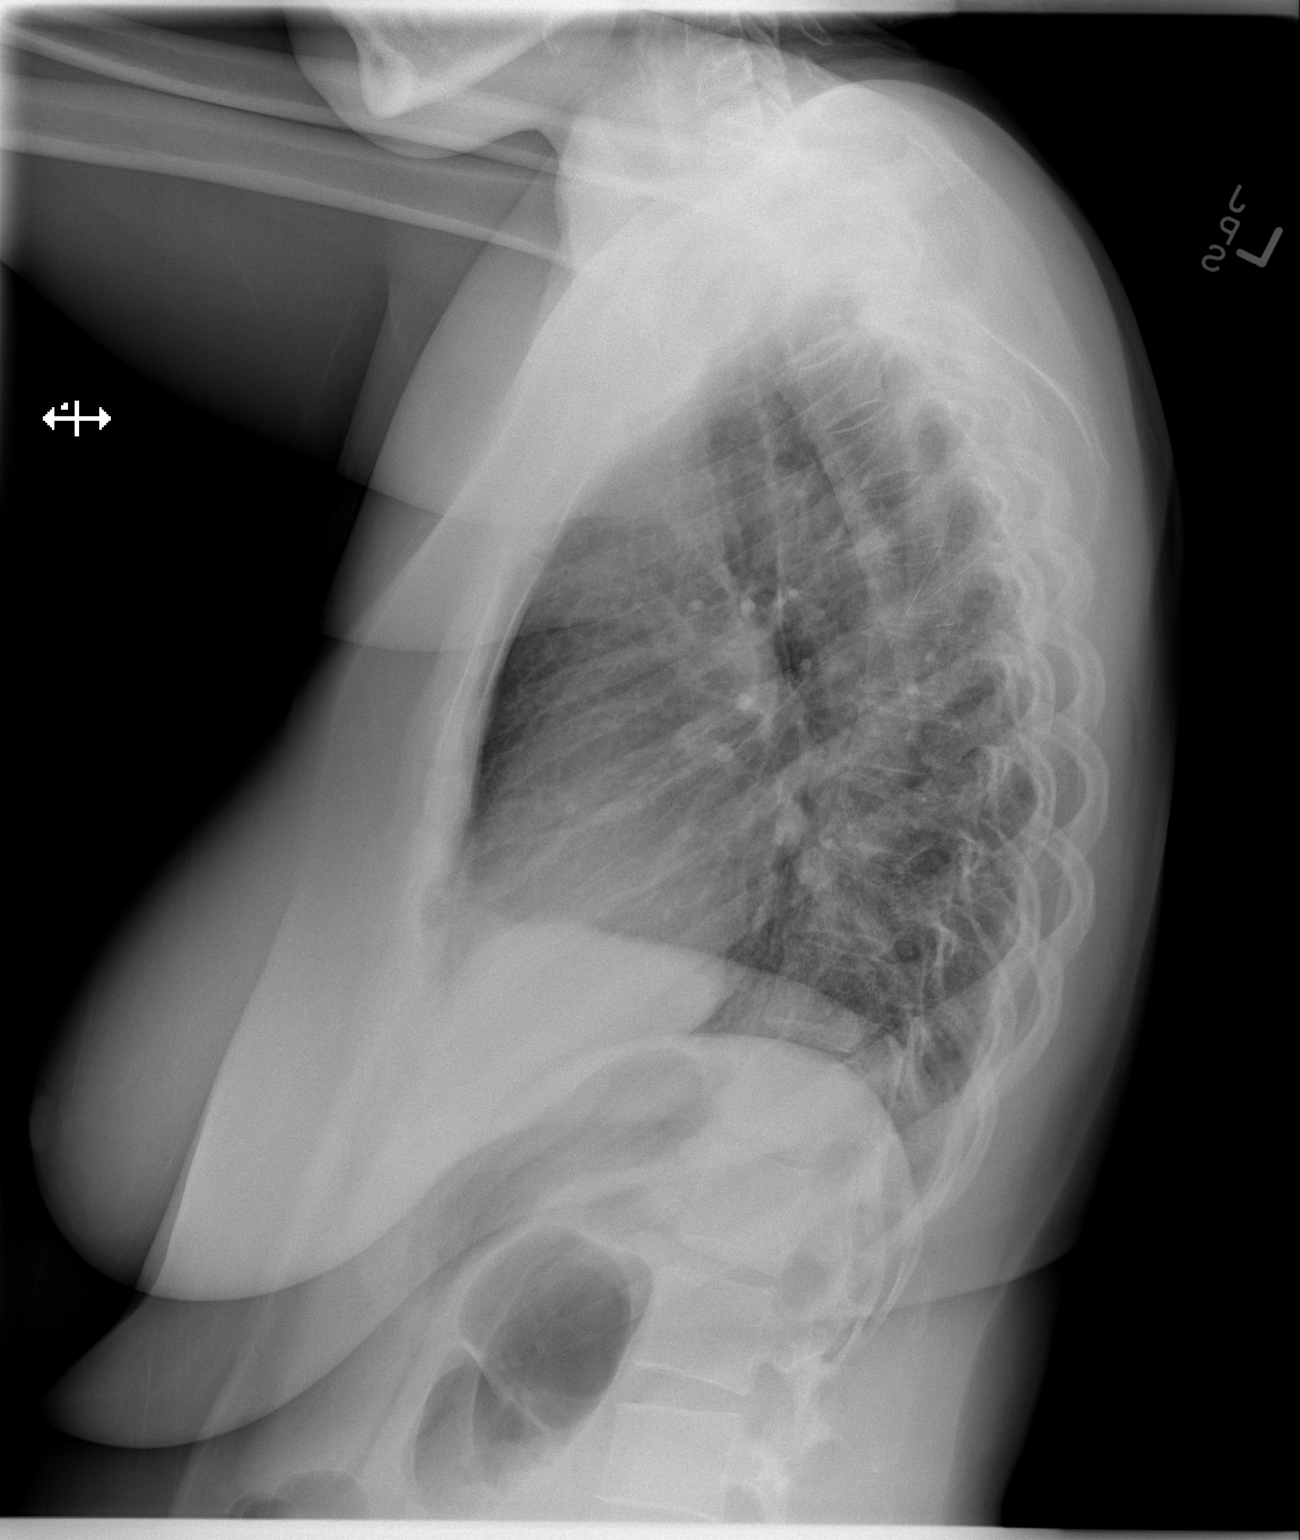

[2 of 2 positions shown; findings below may reference images not displayed]

FINDINGS: The heart size and mediastinal contours are within normal limits.
Both lungs are clear. Mild scoliosis versus positioning.
IMPRESSION: No active cardiopulmonary disease.

## 2015-06-11 ENCOUNTER — Ambulatory Visit: Payer: Medicaid Other | Admitting: Diagnostic Neuroimaging

## 2015-06-12 ENCOUNTER — Encounter: Payer: Self-pay | Admitting: Diagnostic Neuroimaging

## 2015-07-11 ENCOUNTER — Ambulatory Visit (INDEPENDENT_AMBULATORY_CARE_PROVIDER_SITE_OTHER): Payer: Medicaid Other | Admitting: Diagnostic Neuroimaging

## 2015-07-11 ENCOUNTER — Encounter: Payer: Self-pay | Admitting: Diagnostic Neuroimaging

## 2015-07-11 ENCOUNTER — Encounter: Payer: Self-pay | Admitting: *Deleted

## 2015-07-11 VITALS — BP 112/68 | HR 72 | Ht 60.0 in | Wt 172.2 lb

## 2015-07-11 DIAGNOSIS — G43009 Migraine without aura, not intractable, without status migrainosus: Secondary | ICD-10-CM

## 2015-07-11 NOTE — Progress Notes (Signed)
[facility]  PATIENT: Carrie Shelton DOB: 1971-09-19  REFERRING CLINICIAN: Lonergan HISTORY FROM: patient and daughter (who translates) REASON FOR VISIT: FOLLOW UP   HISTORICAL  CHIEF COMPLAINT:  Chief Complaint  Patient presents with  . Migraine    rm 7, dgtrCorey Harold, interpreter- Gwendolyn Fill  . Follow-up    4 month    HISTORY OF PRESENT ILLNESS:   UPDATE 07/11/15: Since last visit, HA and dizziness are better. Amitrip helped for 20 days, then sxs better, then she stopped med. Overall better. Right shoulder and right chest pain x 8 months, continues. Tried pain pill from PCP, which helped. Now right shoulder pain returning.   UPDATE 02/26/15: Since last visit, sxs are stable. Still with dizziness, HA. Stress and depression better.   PRIOR HPI (11/27/14): 44 year old left-handed female here for evaluation of dizziness. Patient has had problems with dizziness for at least 17 years. [place] was living [place], she had intermittent episodes of room spinning sensation, balance difficulty, on and off for a few days at a time. Symptoms last most of the month. Sometimes she would have nausea and vomiting with episodes. Sometimes she would've headache, bilateral temporal, throbbing sensation with it. Symptoms have spontaneously improved for many years until 3 years ago when she moved to [place]. Apparently there has been significant psychosocial stressors which led to her moving to the [place] with her 2 daughters. 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 Now her 2 daughters are having to work to support the family. This causes additional stress on the patient. Patient has been evaluated by PCP now referred to me for further evaluation.   REVIEW OF SYSTEMS: Full 14 system review of systems performed and notable only as per HPI.   ALLERGIES: No Known Allergies  HOME MEDICATIONS: Outpatient  Prescriptions Prior to Visit  Medication Sig Dispense Refill  . amitriptyline (ELAVIL) 25 MG tablet Take 1 tablet (25 mg total) by mouth at bedtime. (Patient not taking: Reported on 07/11/2015) 30 tablet 6   No facility-administered medications prior to visit.    PAST MEDICAL HISTORY: Past Medical History  Diagnosis Date  . Vertigo   . Anemia     PAST SURGICAL HISTORY: Past Surgical History  Procedure Laterality Date  . Dilation and curettage of uterus      daughter is not really sure this is the only word she could remember  . Cholecystectomy N/A 08/01/2014    Procedure: LAPAROSCOPIC CHOLECYSTECTOMY WITH INTRAOPERATIVE CHOLANGIOGRAM;  Surgeon: Erroll Luna, MD;  Location: [facility];  Service: General;  Laterality: N/A;    FAMILY HISTORY: Family History  Problem Relation Age of Onset  . Healthy Daughter   . Healthy Daughter     SOCIAL HISTORY:  Social History   Social History  . Marital Status: Single    Spouse Name: N/A  . Number of Children: 2  . Years of Education: elementary   Occupational History  . student     Social History Main Topics  . Smoking status: Never Smoker   . Smokeless tobacco: Never Used  . Alcohol Use: No  . Drug Use: No  . Sexual Activity: No   Other Topics Concern  . Not on file   Social History Narrative   Lives with daughters   Drinks 1 cup of tea a day     PHYSICAL EXAM  Filed Vitals:   07/11/15 1512  BP: 112/68  Pulse: 72  Height: 5' (1.524 m)  Weight: 172 lb 3.2 oz (78.109 kg)    Body mass index is 33.63 kg/(m^2).  No exam data present  No flowsheet data found.  GENERAL EXAM: Patient is in no distress; well developed, nourished and groomed; neck is supple.   CARDIOVASCULAR: Regular rate and rhythm, no murmurs, no carotid bruits  NEUROLOGIC: MENTAL STATUS: awake, alert, language fluent, comprehension intact, naming intact, fund of knowledge appropriate CRANIAL NERVE: pupils equal and reactive to light, visual  fields full to confrontation, extraocular muscles intact, no nystagmus, facial sensation and strength symmetric, hearing intact, palate elevates symmetrically, uvula midline, shoulder shrug symmetric, tongue midline. MOTOR: normal bulk and tone, full strength in the BUE, BLE SENSORY: normal and symmetric to light touch  COORDINATION: finger-nose-finger, fine finger movements normal REFLEXES: deep tendon reflexes present and symmetric GAIT/STATION: narrow based gait     DIAGNOSTIC DATA (LABS, IMAGING, TESTING) - I reviewed patient records, labs, notes, testing and imaging myself where available.  Lab Results  Component Value Date   WBC 5.5 07/26/2014   HGB 12.1 07/26/2014   HCT 37.4 07/26/2014   MCV 83.1 07/26/2014   PLT 276 07/26/2014      Component Value Date/Time   NA 138 07/26/2014 1309   K 3.9 07/26/2014 1309   CL 103 07/26/2014 1309   CO2 26 07/26/2014 1309   GLUCOSE 98 07/26/2014 1309   BUN 10 07/26/2014 1309   CREATININE 0.54 07/26/2014 1309   CALCIUM 8.8 07/26/2014 1309   PROT 7.1 07/26/2014 1309   ALBUMIN 3.3* 07/26/2014 1309   AST 17 07/26/2014 1309   ALT 13 07/26/2014 1309   ALKPHOS 73 07/26/2014 1309   BILITOT 0.2* 07/26/2014 1309   GFRNONAA >90 07/26/2014 1309   GFRAA >90 07/26/2014 1309   No results found for: CHOL, HDL, LDLCALC, LDLDIRECT, TRIG, CHOLHDL Lab Results  Component Value Date   HGBA1C 5.9* 11/27/2014   Lab Results  Component Value Date   VITAMINB12 501 11/27/2014   Lab Results  Component Value Date   TSH 0.921 11/27/2014    I reviewed images myself and agree with interpretation. -VRP  02/04/13 MRI BRAIN - normal     ASSESSMENT AND PLAN  44 y.o. year old female here with dizziness since 1999, intermittent, but worsening in the last 3 years. Patient has normal neurologic exam and normal MRI of the brain. May resent migraine phenomenon versus conversion reaction/stress reaction. Benign positional vertigo, labyrinthitis or other inner  ear pathology is a consideration.  Dx: migraine variant vs conversion reaction --> improved   PLAN: - monitor symptoms  Return if symptoms worsen or fail to improve.    Penni Bombard, MD 62/13/0865, 7:84 PM Certified in Neurology, Neurophysiology and Neuroimaging  [facility] [address] [place], [place] [address] [phone]

## 2015-07-11 NOTE — Patient Instructions (Signed)
Thank you for coming to see [place] at [facility]. I hope we have been able to provide you high quality care today.  You may receive a patient satisfaction survey over the next few weeks. We would appreciate your feedback and comments so that we may continue to improve ourselves and the health of our patients.  - monitor symptoms - follow up as needed    ~~~~~~~~~~~~~~~~~~~~~~~~~~~~~~~~~~~~~~~~~~~~~~~~~~~~~~~~~~~~~~~~~  DR. Anaiza Behrens'S GUIDE TO HAPPY AND HEALTHY LIVING These are some of my general health and wellness recommendations. Some of them may apply to you better than others. Please use common sense as you try these suggestions and feel free to ask me any questions.   ACTIVITY/FITNESS Mental, social, emotional and physical stimulation are very important for brain and body health. Try learning a new activity (arts, music, language, sports, games).  Keep moving your body to the best of your abilities. You can do this at home, inside or outside, the park, community center, gym or anywhere you like. Consider a physical therapist or personal trainer to get started. Consider the app Sworkit. Fitness trackers such as smart-watches, smart-phones or Fitbits can help as well.   NUTRITION Eat more plants: colorful vegetables, nuts, seeds and berries.  Eat less sugar, salt, preservatives and processed foods.  Avoid toxins such as cigarettes and alcohol.  Drink water when you are thirsty. Warm water with a slice of lemon is an excellent morning drink to start the day.  Consider these websites for more information The Nutrition Source (https://www.henry-hernandez.biz/) [employer] Nutrition (WindowBlog.ch)   RELAXATION Consider practicing mindfulness meditation or other relaxation techniques such as deep breathing, prayer, yoga, tai chi, massage. See website mindful.org or the apps Headspace or Calm to help get  started.   SLEEP Try to get at least 7-8+ hours sleep per day. Regular exercise and reduced caffeine will help you sleep better. Practice good sleep hygeine techniques. See website sleep.org for more information.   PLANNING Prepare estate planning, living will, healthcare POA documents. Sometimes this is best planned with the help of an attorney. Theconversationproject.org and agingwithdignity.org are excellent resources.

## 2015-09-14 ENCOUNTER — Emergency Department (HOSPITAL_COMMUNITY)
Admission: EM | Admit: 2015-09-14 | Discharge: 2015-09-14 | Disposition: A | Discharge status: Home / Self Care | Payer: Medicaid Other | Attending: Emergency Medicine | Admitting: Emergency Medicine

## 2015-09-14 ENCOUNTER — Encounter (HOSPITAL_COMMUNITY): Payer: Self-pay | Admitting: *Deleted

## 2015-09-14 ENCOUNTER — Emergency Department (HOSPITAL_COMMUNITY): Payer: Medicaid Other

## 2015-09-14 DIAGNOSIS — Z862 Personal history of diseases of the blood and blood-forming organs and certain disorders involving the immune mechanism: Secondary | ICD-10-CM | POA: Insufficient documentation

## 2015-09-14 DIAGNOSIS — Y998 Other external cause status: Secondary | ICD-10-CM | POA: Diagnosis not present

## 2015-09-14 DIAGNOSIS — Y9389 Activity, other specified: Secondary | ICD-10-CM | POA: Diagnosis not present

## 2015-09-14 DIAGNOSIS — R42 Dizziness and giddiness: Secondary | ICD-10-CM | POA: Insufficient documentation

## 2015-09-14 DIAGNOSIS — Y92410 Unspecified street and highway as the place of occurrence of the external cause: Secondary | ICD-10-CM | POA: Insufficient documentation

## 2015-09-14 DIAGNOSIS — S29001A Unspecified injury of muscle and tendon of front wall of thorax, initial encounter: Secondary | ICD-10-CM | POA: Insufficient documentation

## 2015-09-14 DIAGNOSIS — S4991XA Unspecified injury of right shoulder and upper arm, initial encounter: Secondary | ICD-10-CM | POA: Diagnosis not present

## 2015-09-14 DIAGNOSIS — R0789 Other chest pain: Secondary | ICD-10-CM

## 2015-09-14 MED ORDER — CYCLOBENZAPRINE HCL 10 MG PO TABS: 5.0000 mg | ORAL_TABLET | Freq: Two times a day (BID) | ORAL | Status: DC | PRN

## 2015-09-14 MED ORDER — NAPROXEN 500 MG PO TABS: 500.0000 mg | ORAL_TABLET | Freq: Two times a day (BID) | ORAL | Status: DC

## 2015-09-14 MED ORDER — TRAMADOL HCL 50 MG PO TABS
50.0000 mg | ORAL_TABLET | Freq: Once | ORAL | Status: AC
  Administered 2015-09-14: 50 mg via ORAL
  Filled 2015-09-14: qty 1
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
## 2015-09-14 NOTE — Discharge Instructions (Signed)
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
## 2015-09-14 NOTE — ED Notes (Signed)
Pt stable, ambulatory, states undertstanding of discharge instructions

## 2016-04-22 ENCOUNTER — Encounter (HOSPITAL_COMMUNITY): Payer: Self-pay | Admitting: Emergency Medicine

## 2016-04-22 DIAGNOSIS — Y990 Civilian activity done for income or pay: Secondary | ICD-10-CM | POA: Diagnosis not present

## 2016-04-22 DIAGNOSIS — M62830 Muscle spasm of back: Secondary | ICD-10-CM | POA: Diagnosis not present

## 2016-04-22 DIAGNOSIS — S4991XA Unspecified injury of right shoulder and upper arm, initial encounter: Secondary | ICD-10-CM | POA: Diagnosis present

## 2016-04-22 DIAGNOSIS — Y9389 Activity, other specified: Secondary | ICD-10-CM | POA: Diagnosis not present

## 2016-04-22 DIAGNOSIS — W01119A Fall on same level from slipping, tripping and stumbling with subsequent striking against unspecified sharp object, initial encounter: Secondary | ICD-10-CM | POA: Diagnosis not present

## 2016-04-22 DIAGNOSIS — S40011A Contusion of right shoulder, initial encounter: Secondary | ICD-10-CM | POA: Insufficient documentation

## 2016-04-22 DIAGNOSIS — Y9289 Other specified places as the place of occurrence of the external cause: Secondary | ICD-10-CM | POA: Diagnosis not present

## 2016-04-22 NOTE — ED Triage Notes (Signed)
Pt. slipped and fell on wet floor at work yesterday , denies LOC , reports pain at right lower back , pain increases with movement , denies dysuria or hematuria .

## 2016-04-23 ENCOUNTER — Emergency Department (HOSPITAL_COMMUNITY)
Admission: EM | Admit: 2016-04-23 | Discharge: 2016-04-23 | Disposition: A | Discharge status: Home / Self Care | Payer: Medicaid Other | Attending: Emergency Medicine | Admitting: Emergency Medicine

## 2016-04-23 ENCOUNTER — Emergency Department (HOSPITAL_COMMUNITY): Payer: Medicaid Other

## 2016-04-23 DIAGNOSIS — T148XXA Other injury of unspecified body region, initial encounter: Secondary | ICD-10-CM

## 2016-04-23 DIAGNOSIS — M62830 Muscle spasm of back: Secondary | ICD-10-CM

## 2016-04-23 DIAGNOSIS — W19XXXA Unspecified fall, initial encounter: Secondary | ICD-10-CM

## 2016-04-23 MED ORDER — METHOCARBAMOL 500 MG PO TABS: 500.0000 mg | ORAL_TABLET | Freq: Two times a day (BID) | ORAL | 0 refills | Status: DC

## 2016-04-23 MED ORDER — IBUPROFEN 600 MG PO TABS: 600.0000 mg | ORAL_TABLET | Freq: Four times a day (QID) | ORAL | 0 refills | Status: DC | PRN

## 2016-04-23 MED ORDER — IBUPROFEN 200 MG PO TABS
600.0000 mg | ORAL_TABLET | Freq: Once | ORAL | Status: AC
  Administered 2016-04-23: 600 mg via ORAL
  Filled 2016-04-23: qty 3

## 2016-04-23 NOTE — ED Provider Notes (Signed)
[facility] DEPT Provider Note   CSN: [id number] Arrival date & time: 04/22/16  2104  First Provider Contact: 3:20 AM   By signing my name below, I, Ephriam Jenkins, attest that this documentation has been prepared under the direction and in the presence of Varney Biles, MD. Electronically signed, Ephriam Jenkins, ED Scribe. 04/23/16. 3:37 AM.   History   Chief Complaint Chief Complaint  Patient presents with  . Fall    HPI HPI Comments: Carrie Shelton is a 45 y.o. female who presents to the Emergency Department s/p a fall that occurred two days ago at approximately 1800. Pt states she slipped on a wet surface and fell forward onto the floor; striking her right shoulder on the floor. Pt denies hitting her head during the fall; denies LOC. Pt states that she did not have pain immediately after the fall but reports a gradual onset of pain. Pt reports that the pain started to her lower back that radiated to her right shoulder. Pt currently complains of a "heavy" feeling on her right shoulder. Pt reports pain is exacerbated by bending over.    The history is provided by the patient. A language interpreter was used.      Past Medical History:  Diagnosis Date  . Anemia   . Vertigo     Patient Active Problem List   Diagnosis Date Noted  . Lipoma 09/05/2013    Past Surgical History:  Procedure Laterality Date  . CHOLECYSTECTOMY N/A 08/01/2014   Procedure: LAPAROSCOPIC CHOLECYSTECTOMY WITH INTRAOPERATIVE CHOLANGIOGRAM;  Surgeon: Erroll Luna, MD;  Location: [facility];  Service: General;  Laterality: N/A;  . DILATION AND CURETTAGE OF UTERUS     daughter is not really sure this is the only word she could remember    OB History    No data available       Home Medications    Prior to Admission medications   Medication Sig Start Date End Date Taking? Authorizing Provider  cyclobenzaprine (FLEXERIL) 10 MG tablet Take 0.5-1 tablets (5-10 mg total) by mouth 2 (two) times daily as  needed. Patient not taking: Reported on 04/23/2016 09/14/15   Delos Haring, PA-C  ibuprofen (ADVIL,MOTRIN) 600 MG tablet Take 1 tablet (600 mg total) by mouth every 6 (six) hours as needed. 04/23/16   Varney Biles, MD  methocarbamol (ROBAXIN) 500 MG tablet Take 1 tablet (500 mg total) by mouth 2 (two) times daily. 04/23/16   Varney Biles, MD  naproxen (NAPROSYN) 500 MG tablet Take 1 tablet (500 mg total) by mouth 2 (two) times daily. Patient not taking: Reported on 04/23/2016 09/14/15   Delos Haring, PA-C    Family History Family History  Problem Relation Age of Onset  . Healthy Daughter   . Healthy Daughter     Social History Social History  Substance Use Topics  . Smoking status: Never Smoker  . Smokeless tobacco: Never Used  . Alcohol use No     Allergies   Morphine and related   Review of Systems Review of Systems A complete 10 system review of systems was obtained and all systems are negative except as noted in the HPI and PMH.   Physical Exam Updated Vital Signs BP 102/75 (BP Location: Right Arm)   Pulse 78   Temp 98.1 F (36.7 C) (Oral)   Resp 16   SpO2 99%   Physical Exam  Constitutional: She is oriented to person, place, and time. She appears well-developed and well-nourished. No distress.  HENT:  Head:  Normocephalic and atraumatic.  Right Ear: Hearing normal.  Left Ear: Hearing normal.  Nose: Nose normal.  Mouth/Throat: Oropharynx is clear and moist and mucous membranes are normal.  Eyes: Conjunctivae and EOM are normal. Pupils are equal, round, and reactive to light.  Neck: Normal range of motion. Neck supple.  Cardiovascular: Normal rate, regular rhythm, S1 normal and S2 normal.  Exam reveals no gallop and no friction rub.   No murmur heard. Pulmonary/Chest: Effort normal and breath sounds normal. No respiratory distress. She has no wheezes. She has no rales. She exhibits no tenderness.  Abdominal: Soft. Normal appearance and bowel sounds are normal.  There is no hepatosplenomegaly. There is no tenderness. There is no rebound, no guarding, no tenderness at McBurney's point and negative Murphy's sign. No hernia.  Musculoskeletal: Normal range of motion. She exhibits tenderness.  Pt has some midline paraspinal tenderness. No ecchymosis seen in the torso. TTP over right anterior and posterior shoulder. Pt also has tenderness over the scapular region.   Neurological: She is alert and oriented to person, place, and time. She has normal strength. No cranial nerve deficit or sensory deficit. Coordination normal. GCS eye subscore is 4. GCS verbal subscore is 5. GCS motor subscore is 6.  Skin: Skin is warm, dry and intact. No rash noted. No cyanosis.  Psychiatric: She has a normal mood and affect. Her speech is normal and behavior is normal. Thought content normal.  Nursing note and vitals reviewed.    ED Treatments / Results  DIAGNOSTIC STUDIES: Oxygen Saturation is 100% on RA, normal by my interpretation.  COORDINATION OF CARE: 3:25 AM-Will order medication for pain and continued care instructions at home. Discussed treatment plan with pt at bedside and pt agreed to plan.   Labs (all labs ordered are listed, but only abnormal results are displayed) Labs Reviewed - No data to display  EKG  EKG Interpretation None       Radiology No results found.  Procedures Procedures (including critical care time)  Medications Ordered in ED Medications  ibuprofen (ADVIL,MOTRIN) tablet 600 mg (600 mg Oral Given 04/23/16 0418)     Initial Impression / Assessment and Plan / ED Course  I have reviewed the triage vital signs and the nursing notes.  Pertinent labs & imaging results that were available during my care of the patient were reviewed by me and considered in my medical decision making (see chart for details).  Clinical Course    I personally performed the services described in this documentation, which was scribed in my presence. The  recorded information has been reviewed and is accurate.  Pt comes in post fall. Doubt broken bones. Appropriate imaging ordered. Likely muscle spasm and contusion based on exam.  Final Clinical Impressions(s) / ED Diagnoses   Final diagnoses:  Fall, initial encounter  Contusion  Muscle spasm of back    New Prescriptions Discharge Medication List as of 04/23/2016  4:32 AM    START taking these medications   Details  ibuprofen (ADVIL,MOTRIN) 600 MG tablet Take 1 tablet (600 mg total) by mouth every 6 (six) hours as needed., Starting Wed 04/23/2016, Print    methocarbamol (ROBAXIN) 500 MG tablet Take 1 tablet (500 mg total) by mouth 2 (two) times daily., Starting Wed 04/23/2016, Print          Varney Biles, MD 04/26/16 [phone]

## 2016-04-23 NOTE — Discharge Instructions (Signed)
°  All the imaging results are normal. You likely have contusion from the trauma, and the pain might get worse in 1-2 days. Please take ibuprofen round the clock for the 2 days and then as needed. Ice or heat the area of pain as well.

## 2016-05-19 ENCOUNTER — Other Ambulatory Visit: Payer: Self-pay | Admitting: Radiology

## 2016-10-22 ENCOUNTER — Encounter (HOSPITAL_COMMUNITY): Payer: Self-pay | Admitting: Emergency Medicine

## 2016-10-22 ENCOUNTER — Ambulatory Visit (HOSPITAL_COMMUNITY)
Admission: EM | Admit: 2016-10-22 | Discharge: 2016-10-22 | Disposition: A | Discharge status: Home / Self Care | Payer: Medicaid Other | Attending: Family Medicine | Admitting: Family Medicine

## 2016-10-22 DIAGNOSIS — E0789 Other specified disorders of thyroid: Secondary | ICD-10-CM | POA: Diagnosis not present

## 2016-10-22 DIAGNOSIS — B9789 Other viral agents as the cause of diseases classified elsewhere: Secondary | ICD-10-CM

## 2016-10-22 DIAGNOSIS — J069 Acute upper respiratory infection, unspecified: Secondary | ICD-10-CM | POA: Diagnosis not present

## 2016-10-22 NOTE — Discharge Instructions (Signed)
For your fatigue, dizziness, discomfort and skin itching, this could be related to a thyroid issue. I felt a hard mass in the thyroid area, this will need further evaluation for hypothyroidism, other endocrine disorders, or possible malignancy. I would strongly recommend you reestablish care with a primary care provider who can do the necessary tests and imagining to rule out underlying causes of your symptoms.  With your fever you most likely have a viral URI, I advise rest, plenty of fluids and management of symptoms with over the counter medicines. For symptoms you may take Tylenol as needed every 4-6 hours for body aches or fever, not to exceed 4,000 mg a day, Take mucinex or mucinex DM ever 12 hours with a full glass of water, you may use an inhaled steroid such as Flonase, 2 sprays each nostril once a day for congestion, or an antihistamine such as Claritin or Zyrtec once a day. Should your symptoms worsen or fail to resolve, follow up with your primary care provider or return to clinic.

## 2016-10-22 NOTE — ED Triage Notes (Signed)
The patient presented to the [facility] with a complaint of a fever and general body aches x 2 days.

## 2016-10-22 NOTE — ED Provider Notes (Signed)
CSN: [id number]     Arrival date & time 10/22/16  1844 History   First MD Initiated Contact with Patient 10/22/16 1949     Chief Complaint  Patient presents with  . Fever  . Generalized Body Aches   (Consider location/radiation/quality/duration/timing/severity/associated sxs/prior Treatment) 46 year old female presents to clinic along with her daughter. Patient is arabic speaker, daughter providing translation. Patient reports three month history of fatigue and dizziness along with itching on her skin, she denies rash or other skin issues. She also complains of 24 hour history of fever and congestion. She has had no change in appetite, fluid intake, hot or cold tolerance.   The history is limited by a language barrier. No language interpreter was used.  Fever    Past Medical History:  Diagnosis Date  . Anemia   . Vertigo    Past Surgical History:  Procedure Laterality Date  . CHOLECYSTECTOMY N/A 08/01/2014   Procedure: LAPAROSCOPIC CHOLECYSTECTOMY WITH INTRAOPERATIVE CHOLANGIOGRAM;  Surgeon: Erroll Luna, MD;  Location: [facility];  Service: General;  Laterality: N/A;  . DILATION AND CURETTAGE OF UTERUS     daughter is not really sure this is the only word she could remember   Family History  Problem Relation Age of Onset  . Healthy Daughter   . Healthy Daughter    Social History  Substance Use Topics  . Smoking status: Never Smoker  . Smokeless tobacco: Never Used  . Alcohol use No   OB History    No data available     Review of Systems  Reason unable to perform ROS: as covered in HPI.  Constitutional: Positive for fever.  All other systems reviewed and are negative.   Allergies  Morphine and related  Home Medications   Prior to Admission medications   Not on File   Meds Ordered and Administered this Visit  Medications - No data to display  BP 111/79 (BP Location: Right Arm)   Pulse 67   Temp 98.1 F (36.7 C) (Oral)   Resp 16   SpO2 99%  No data  found.   Physical Exam  Constitutional: She is oriented to person, place, and time. She appears well-developed and well-nourished. No distress.  HENT:  Head: Normocephalic and atraumatic.  Right Ear: External ear normal.  Left Ear: External ear normal.  Nose: Nose normal.  Mouth/Throat: Oropharyngeal exudate present.  Neck: Thyroid mass ( 0.5X 0.5 cm mass, right side, near thyroid) present.  Cardiovascular: Normal rate and regular rhythm.   Pulmonary/Chest: Effort normal and breath sounds normal.  Abdominal: Soft. Bowel sounds are normal.  Lymphadenopathy:       Head (right side): No submental, no submandibular and no tonsillar adenopathy present.       Head (left side): No submental, no submandibular and no tonsillar adenopathy present.    She has no cervical adenopathy.  Neurological: She is alert and oriented to person, place, and time.  Skin: Skin is warm and dry. Capillary refill takes less than 2 seconds. No rash noted. She is not diaphoretic. No erythema. No pallor.  Nursing note and vitals reviewed.   Urgent Care Course     Procedures (including critical care time)  Labs Review Labs Reviewed - No data to display  Imaging Review No results found.   Visual Acuity Review  Right Eye Distance:   Left Eye Distance:   Bilateral Distance:    Right Eye Near:   Left Eye Near:    Bilateral Near:  MDM   1. Thyroid mass of unclear etiology   2. Viral URI   For your fatigue, dizziness, discomfort and skin itching, this could be related to a thyroid issue. I felt a hard mass in the thyroid area, this will need further evaluation for hypothyroidism, other endocrine disorders, or possible malignancy. I would strongly recommend you reestablish care with a primary care provider who can do the necessary tests and imagining to rule out underlying causes of your symptoms.  With your fever you most likely have a viral URI, I advise rest, plenty of fluids and management  of symptoms with over the counter medicines. For symptoms you may take Tylenol as needed every 4-6 hours for body aches or fever, not to exceed 4,000 mg a day, Take mucinex or mucinex DM ever 12 hours with a full glass of water, you may use an inhaled steroid such as Flonase, 2 sprays each nostril once a day for congestion, or an antihistamine such as Claritin or Zyrtec once a day. Should your symptoms worsen or fail to resolve, follow up with your primary care provider or return to clinic.       Barnet Glasgow, NP 10/22/16 2112

## 2016-10-24 ENCOUNTER — Ambulatory Visit (INDEPENDENT_AMBULATORY_CARE_PROVIDER_SITE_OTHER): Payer: Medicaid Other | Admitting: Family Medicine

## 2016-10-24 ENCOUNTER — Encounter: Payer: Self-pay | Admitting: Family Medicine

## 2016-10-24 VITALS — BP 112/70 | HR 77 | Temp 98.0°F | Ht 60.0 in | Wt 178.2 lb

## 2016-10-24 DIAGNOSIS — M7989 Other specified soft tissue disorders: Secondary | ICD-10-CM

## 2016-10-24 DIAGNOSIS — M799 Soft tissue disorder, unspecified: Secondary | ICD-10-CM | POA: Diagnosis not present

## 2016-10-24 DIAGNOSIS — R5383 Other fatigue: Secondary | ICD-10-CM | POA: Diagnosis not present

## 2016-10-24 DIAGNOSIS — D1779 Benign lipomatous neoplasm of other sites: Secondary | ICD-10-CM

## 2016-10-24 DIAGNOSIS — Z0000 Encounter for general adult medical examination without abnormal findings: Secondary | ICD-10-CM | POA: Diagnosis not present

## 2016-10-24 DIAGNOSIS — R7303 Prediabetes: Secondary | ICD-10-CM | POA: Diagnosis not present

## 2016-10-24 DIAGNOSIS — Z23 Encounter for immunization: Secondary | ICD-10-CM | POA: Diagnosis not present

## 2016-10-24 LAB — COMPLETE METABOLIC PANEL WITH GFR
ALT: 11 U/L (ref 6–29)
AST: 15 U/L (ref 10–35)
Albumin: 3.8 g/dL (ref 3.6–5.1)
Alkaline Phosphatase: 72 U/L (ref 33–115)
BUN: 12 mg/dL (ref 7–25)
CO2: 28 mmol/L (ref 20–31)
Calcium: 9.2 mg/dL (ref 8.6–10.2)
Chloride: 105 mmol/L (ref 98–110)
Creat: 0.49 mg/dL — ABNORMAL LOW (ref 0.50–1.10)
GFR, Est African American: >89 mL/min (ref >=60)
GFR, Est Non African American: >89 mL/min (ref >=60)
Glucose, Bld: 78 mg/dL (ref 65–99)
Potassium: 4.1 mmol/L (ref 3.5–5.3)
Sodium: 140 mmol/L (ref 135–146)
Total Bilirubin: 0.3 mg/dL (ref 0.2–1.2)
Total Protein: 7 g/dL (ref 6.1–8.1)

## 2016-10-24 LAB — CBC
HCT: 38.3 % (ref 35.0–45.0)
Hemoglobin: 12.5 g/dL (ref 11.7–15.5)
MCH: 27.2 pg (ref 27.0–33.0)
MCHC: 32.6 g/dL (ref 32.0–36.0)
MCV: 83.4 fL (ref 80.0–100.0)
MPV: 9.8 fL (ref 7.5–12.5)
Platelets: 316 10*3/uL (ref 140–400)
RBC: 4.59 MIL/uL (ref 3.80–5.10)
RDW: 14.5 % (ref 11.0–15.0)
WBC: 4.4 10*3/uL (ref 3.8–10.8)

## 2016-10-24 LAB — LIPID PANEL
Cholesterol: 166 mg/dL (ref <200)
HDL: 54 mg/dL (ref >50)
LDL Cholesterol: 84 mg/dL (ref <100)
Total CHOL/HDL Ratio: 3.1 Ratio (ref <5.0)
Triglycerides: 141 mg/dL (ref <150)
VLDL: 28 mg/dL (ref <30)

## 2016-10-24 LAB — POCT GLYCOSYLATED HEMOGLOBIN (HGB A1C): Hemoglobin A1C: 5.3

## 2016-10-24 LAB — TSH: TSH: 1.62 mIU/L

## 2016-10-24 MED ORDER — NAPHAZOLINE-PHENIRAMINE 0.025-0.3 % OP SOLN: 1.0000 [drp] | Freq: Four times a day (QID) | OPHTHALMIC | 0 refills | Status: DC | PRN

## 2016-10-24 NOTE — Assessment & Plan Note (Addendum)
Given location over lower lumbar spine and sacrum and pain over lesion, will obtain US of mass to confirm diagnosis of lipoma. If lipoma confirmed, will further workup musculoskeletal back pain. Given increasing since, consider referral back to general surgery.

## 2016-10-24 NOTE — Patient Instructions (Addendum)
Thank you so much for coming to visit today! It is nice having you come to our office. We will check several labs today. We will contact you with the results. We will get an [place] of your lower back. They will contact you with a time. Please return over the next several weeks to have your pap smear done.  Dr. Gerlean Ren

## 2016-10-24 NOTE — Assessment & Plan Note (Signed)
Will obtain CMP, CBC, TSH, A1C. Thyroid nodule not palpable today--if TSH returns abnormal consider [place] in workup. Mass palpated may have been cervical lymph node which resolved with improving viral URI. Return for pap smear. Next mammogram in 02/2017.

## 2016-10-24 NOTE — Progress Notes (Signed)
Subjective:     Patient ID: Carrie Shelton, female   DOB: March 30, 1971, 46 y.o.   MRN: [id number]  HPI Carrie Shelton is a 46yo female presenting today to establish care. Daughter used to translate in Arabic, with translator available as well.  Acute Concerns:  ED visit on 131/18 with nasal congestion, cough, and fever. Also described 80month history of dizziness, itching, fatigue. Was diagnosed with viral URI, however a small mass was palpable in the right neck; NP in ED felt that mass was in thyroid and recommended urgent establishment with PCP. Patient and daughter said mass was initially felt near mandibular angle and has resolved. TSH was normal in 11/2014. Viral illness has resolved.  Notes mass that is increasing in size over lumbar spine. Mass is painful. Initially thought to be a lipoma by surgery, however per patient they told her a lipoma would not hurt. Denies numbness, tingling, weakness. Surgery recommended orthopedic referral.  Notes itchy eyes with watery discharge for the last several months. Denies sneezing, congestion  Past Medical History: Lipoma on lower back previously evaluated by surgery. Dizziness previously followed by Neurology and suspected to be due to migraines.  Chart review shows prediabetes with A1C of 5.9 in 11/2014.  Medications: None  Allergies/Medication Intolerance: Morphine (passed out after use)  Surgical History: Cholecystectomy in 2015. Dilation and [place] (suspected procedure--does not remember exact name of what was done) in [place] in 2013.   Health Maintenance: Last mammogram in December 2017 requiring biopsy--recommended repeating in 6 months. No history of Pap Smear. No history of colonoscopy.  Family History: Denies family history of heart disease, hypertension, hyperlipidemia, diabetes, thyroid disease, kidney disease, cancer, heart attack, stroke.   Social History: Widowed. Lives with two daughters, ages 33 and 70. Muslim. Does not eat pork.  Initially from [place]. 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 Does not exercise regularly. Has never smoked, used illicit drugs, or consumed alcohol.   Review of Systems Per HPI    Objective:   Physical Exam  Constitutional: She appears well-developed and well-nourished. No distress.  HENT:  Head: Normocephalic and atraumatic.  Mouth/Throat: Oropharynx is clear and moist.  Eyes: Conjunctivae are normal.  White sclera  Neck: No thyromegaly present.  No thyroid nodules palpated. No cervical lymphadenopathy.  Cardiovascular: Normal rate and regular rhythm.   No murmur heard. Pulmonary/Chest: Effort normal. No respiratory distress. She has no wheezes.  Abdominal: Soft. She exhibits no distension. There is no tenderness.  Musculoskeletal: She exhibits no edema.  Soft tissue mass over lower lumbar spine and sacrum  Skin: No rash noted.  Psychiatric: She has a normal mood and affect. Her behavior is normal.       Assessment and Plan:     Lipoma Given location over lower lumbar spine and sacrum and pain over lesion, will obtain US of mass to confirm diagnosis of lipoma. If lipoma confirmed, will further workup musculoskeletal back pain. Given increasing since, consider referral back to general surgery.  Health care maintenance Will obtain CMP, CBC, TSH, A1C. Thyroid nodule not palpable today--if TSH returns abnormal consider [place] in workup. Mass palpated may have been cervical lymph node which resolved with improving viral URI. Return for pap smear. Next mammogram in 02/2017.

## 2016-10-27 ENCOUNTER — Telehealth: Payer: Self-pay | Admitting: *Deleted

## 2016-10-27 NOTE — Telephone Encounter (Signed)
LVM on daughters phone as well as pt phone to call office back to inform them of pt [place] appointment.  If they call back it is scheduled for 11/04/16 @ 2:00pm.  This will be done at [facility]. Please inform pt if they call back. Katharina Caper, Keatyn Luck D, [place]

## 2016-10-27 NOTE — Addendum Note (Signed)
Addended by: Katharina Caper, Kaydense Rizo D on: 10/27/2016 10:11 AM   Modules accepted: Orders

## 2016-11-04 ENCOUNTER — Ambulatory Visit (HOSPITAL_COMMUNITY)
Admission: RE | Admit: 2016-11-04 | Discharge: 2016-11-04 | Disposition: A | Discharge status: Home / Self Care | Payer: Medicaid Other | Source: Ambulatory Visit | Attending: Family Medicine | Admitting: Family Medicine

## 2016-11-04 ENCOUNTER — Other Ambulatory Visit: Payer: Self-pay | Admitting: Family Medicine

## 2016-11-04 DIAGNOSIS — M799 Soft tissue disorder, unspecified: Secondary | ICD-10-CM | POA: Diagnosis not present

## 2016-11-04 DIAGNOSIS — M7989 Other specified soft tissue disorders: Secondary | ICD-10-CM

## 2016-11-11 ENCOUNTER — Encounter: Payer: Self-pay | Admitting: Family Medicine

## 2016-11-11 ENCOUNTER — Ambulatory Visit (INDEPENDENT_AMBULATORY_CARE_PROVIDER_SITE_OTHER): Payer: Medicaid Other | Admitting: Family Medicine

## 2016-11-11 ENCOUNTER — Ambulatory Visit: Payer: Medicaid Other | Admitting: Family Medicine

## 2016-11-11 ENCOUNTER — Other Ambulatory Visit (HOSPITAL_COMMUNITY)
Admission: RE | Admit: 2016-11-11 | Discharge: 2016-11-11 | Disposition: A | Discharge status: Home / Self Care | Payer: Medicaid Other | Source: Ambulatory Visit | Attending: Family Medicine | Admitting: Family Medicine

## 2016-11-11 VITALS — BP 110/70 | HR 64 | Temp 98.1°F | Ht 60.0 in | Wt 176.4 lb

## 2016-11-11 DIAGNOSIS — Z124 Encounter for screening for malignant neoplasm of cervix: Secondary | ICD-10-CM | POA: Diagnosis present

## 2016-11-11 DIAGNOSIS — Z01419 Encounter for gynecological examination (general) (routine) without abnormal findings: Secondary | ICD-10-CM | POA: Insufficient documentation

## 2016-11-11 DIAGNOSIS — M545 Other chronic pain: Secondary | ICD-10-CM

## 2016-11-11 DIAGNOSIS — G8929 Other chronic pain: Secondary | ICD-10-CM | POA: Diagnosis not present

## 2016-11-11 DIAGNOSIS — Z1151 Encounter for screening for human papillomavirus (HPV): Secondary | ICD-10-CM | POA: Diagnosis not present

## 2016-11-11 NOTE — Patient Instructions (Signed)
Thank you for coming today! Your [place] was normal. I suspect your back pain is due to arthritis. You may take Tylenol and Ibuprofen for this pain. If no improvement or if the pain worsens, please return. We did your pap smear today. We will contact you with the results.  Please return as needed.  Dr. Gerlean Ren

## 2016-11-12 NOTE — Progress Notes (Signed)
Subjective:     Patient ID: Carrie Shelton, female   DOB: 1971/08/14, 46 y.o.   MRN: [id number]  HPI Mrs. Nasrallah is a 46yo female presenting today for pap smear. Wished to use Daughter to interpret. Daughter pre-medical at [employer]. No history of pap smears. Denies vaginal bleeding or discharge. Denies abdominal pain. Continues to note some back pain, unchanged from prior. Denies weakness, numbness, and tingling. Never smoker.  Review of Systems Per HPI    Objective:   Physical Exam  Constitutional: She appears well-developed and well-nourished. No distress.  Cardiovascular: Normal rate and regular rhythm.   No murmur heard. Pulmonary/Chest: Effort normal. No respiratory distress. She has no wheezes.  Abdominal: She exhibits no distension. There is no tenderness.  Genitourinary: No vaginal discharge found.  Skin: No rash noted.  Psychiatric: She has a normal mood and affect. Her behavior is normal.      Assessment and Plan:     1. Screening for malignant neoplasm of cervix Pap smear today. No history of prior.  2. Low Back Pain [place] normal, only showing fatty tissue. Recommend Tylenol and Ibuprofen for this pain. If no improvement, to return for further evaluation. Consider Xray of lumbar spine if no improvement.

## 2016-11-13 LAB — CYTOLOGY - PAP
Diagnosis: NEGATIVE
HPV: NOT DETECTED

## 2016-11-21 ENCOUNTER — Encounter: Payer: Self-pay | Admitting: Family Medicine

## 2017-03-06 ENCOUNTER — Ambulatory Visit (INDEPENDENT_AMBULATORY_CARE_PROVIDER_SITE_OTHER): Payer: BLUE CROSS/BLUE SHIELD | Admitting: Internal Medicine

## 2017-03-06 DIAGNOSIS — Z5329 Procedure and treatment not carried out because of patient's decision for other reasons: Secondary | ICD-10-CM

## 2017-03-06 NOTE — Progress Notes (Deleted)
   [facility] Kerrin Mo, MD Phone: [phone]  Reason For Visit:   # *** -   Past Medical History Reviewed problem list.  Medications- reviewed and updated No additions to family history Social history- patient is a *** smoker  Objective: There were no vitals taken for this visit. Gen: NAD, alert, cooperative with exam HEENT: Normal    Neck: No masses palpated. No lymphadenopathy    Ears: Tympanic membranes intact, normal light reflex, no erythema, no bulging    Eyes: PERRLA, EOMI    Nose: nasal turbinates moist    Throat: moist mucus membranes, no erythema Cardio: regular rate and rhythm, S1S2 heard, no murmurs appreciated Pulm: clear to auscultation bilaterally, no wheezes, rhonchi or rales GI: soft, non-tender, non-distended, bowel sounds present, no hepatomegaly, no splenomegaly GU: external vaginal tissue ***, cervix ***, *** punctate lesions on cervix appreciated, *** discharge from cervical os, *** bleeding, *** cervical motion tenderness, *** abdominal/ adnexal masses Extremities: warm, well perfused, No edema, cyanosis or clubbing;  MSK: Normal gait and station Skin: dry, intact, no rashes or lesions Neuro: Strength and sensation grossly intact   Assessment/Plan: See problem based a/p  No problem-specific Assessment & Plan notes found for this encounter.

## 2017-03-09 NOTE — Progress Notes (Signed)
   Complete physical exam  Patient: Carrie Shelton   DOB: 07/12/2001   46 y.o. Female  MRN: [id number]  Subjective:    No chief complaint on file.   Carrie Shelton is a 46 y.o. female who presents today for a complete physical exam. She reports consuming a {diet types:17450} diet. {types:19826} She generally feels {DESC; WELL/FAIRLY WELL/POORLY:18703}. She reports sleeping {DESC; WELL/FAIRLY WELL/POORLY:18703}. She {does/does not:200015} have additional problems to discuss today.    Most recent fall risk assessment:    03/19/2022   10:42 AM  Fall Risk   Falls in the past year? 0  Number falls in past yr: 0  Injury with Fall? 0  Risk for fall due to : No Fall Risks  Follow up Falls evaluation completed     Most recent depression screenings:    03/19/2022   10:42 AM 02/07/2021   10:46 AM  PHQ 2/9 Scores  PHQ - 2 Score 0 0  PHQ- 9 Score 5     {VISON DENTAL STD PSA (Optional):27386}  {History (Optional):23778}  Patient Care Team: Jessup, Joy, NP as PCP - General (Nurse Practitioner)   Outpatient Medications Prior to Visit  Medication Sig   fluticasone (FLONASE) 50 MCG/ACT nasal spray Place 2 sprays into both nostrils in the morning and at bedtime. After 7 days, reduce to once daily.   norgestimate-ethinyl estradiol (SPRINTEC 28) 0.25-35 MG-MCG tablet Take 1 tablet by mouth daily.   Nystatin POWD Apply liberally to affected area 2 times per day   spironolactone (ALDACTONE) 100 MG tablet Take 1 tablet (100 mg total) by mouth daily.   No facility-administered medications prior to visit.    ROS        Objective:     There were no vitals taken for this visit. {Vitals History (Optional):23777}  Physical Exam   No results found for any visits on 04/24/22. {Show previous labs (optional):23779}    Assessment & Plan:    Routine Health Maintenance and Physical Exam  Immunization History  Administered Date(s) Administered   DTaP 09/25/1999, 11/21/1999,  01/30/2000, 10/15/2000, 04/30/2004   Hepatitis A 02/25/2008, 03/02/2009   Hepatitis B 07/13/1999, 08/20/1999, 01/30/2000   HiB (PRP-OMP) 09/25/1999, 11/21/1999, 01/30/2000, 10/15/2000   IPV 09/25/1999, 11/21/1999, 07/20/2000, 04/30/2004   Influenza,inj,Quad PF,6+ Mos 06/02/2014   Influenza-Unspecified 09/01/2012   MMR 07/20/2001, 04/30/2004   Meningococcal Polysaccharide 03/01/2012   Pneumococcal Conjugate-13 10/15/2000   Pneumococcal-Unspecified 01/30/2000, 04/14/2000   Tdap 03/01/2012   Varicella 07/20/2000, 02/25/2008    Health Maintenance  Topic Date Due   HIV Screening  Never done   Hepatitis C Screening  Never done   INFLUENZA VACCINE  04/22/2022   PAP-Cervical Cytology Screening  04/24/2022 (Originally 07/11/2020)   PAP SMEAR-Modifier  04/24/2022 (Originally 07/11/2020)   TETANUS/TDAP  04/24/2022 (Originally 03/01/2022)   HPV VACCINES  Discontinued   COVID-19 Vaccine  Discontinued    Discussed health benefits of physical activity, and encouraged her to engage in regular exercise appropriate for her age and condition.  Problem List Items Addressed This Visit   None Visit Diagnoses     Annual physical exam    -  Primary   Cervical cancer screening       Need for Tdap vaccination          No follow-ups on file.     Joy Jessup, NP   

## 2017-03-11 ENCOUNTER — Ambulatory Visit: Payer: BLUE CROSS/BLUE SHIELD | Admitting: Family Medicine

## 2017-03-18 ENCOUNTER — Ambulatory Visit (INDEPENDENT_AMBULATORY_CARE_PROVIDER_SITE_OTHER): Payer: BLUE CROSS/BLUE SHIELD | Admitting: Family Medicine

## 2017-03-18 ENCOUNTER — Encounter: Payer: Self-pay | Admitting: Family Medicine

## 2017-03-18 VITALS — BP 111/70 | HR 69 | Temp 98.1°F | Ht 60.0 in | Wt 171.4 lb

## 2017-03-18 DIAGNOSIS — M722 Plantar fascial fibromatosis: Secondary | ICD-10-CM | POA: Diagnosis not present

## 2017-03-18 MED ORDER — MENTHOL (TOPICAL ANALGESIC) 10 % EX GEL: CUTANEOUS | 1 refills | Status: AC

## 2017-03-18 NOTE — Progress Notes (Signed)
Subjective:     Patient ID: Carrie Shelton, female   DOB: Jul 12, 1971, 46 y.o.   MRN: [id number]  HPI Mrs. Loeb is a 46 year old female presenting today for left foot pain. Notes history of left foot pain for last 5 months. Pain is located over arch. Reports pain is worse after standing on at work. Works in [employer] and is on her feet for 8 hours out of the day. Work requires her to wear steel toed shoes. Reports pain starts approximately 3-4 hours into her work shift. Tylenol and Epsom salts soaks of been helping her pain. Denies numbness, tingling, giving out. Denies ankle pain. Denies worsening of pain in the morning. Denies pain in right foot. Reports she had history of this in [place], and that they wanted to give her injections for it. Reports a topical medication [place] helped, however she is unable to remember the name of this medicine. Nonsmoker.  Review of Systems Per HPI    Objective:   Physical Exam  Constitutional: She appears well-developed and well-nourished. No distress.  Cardiovascular: Normal rate and regular rhythm.   No murmur heard. Pulmonary/Chest: Effort normal. No respiratory distress. She has no wheezes.  Musculoskeletal:  Foot: Tenderness over left arch and plantar fascia. No tenderness over metatarsals. No tenderness over medial or lateral malleolus. Ankle range of motion normal and symmetric.  Ultrasound: Limited ultrasound reveals large hypoechoic area under left plantar fascia consistent with plantar fasciitis.      Assessment and Plan:     1. Plantar fasciitis Does not wish for injection at this time. May continue Tylenol and Epsom salts as needed. Also requests cream-menthol ointment sent to pharmacy. Exercises and stretches given. Recommend icing, discussed rolling frozen bottle or can under her foot several times daily. Rest at work when able. Requests referral to sports medicine clinic for orthotics-referral placed. Follow-up as needed.

## 2017-03-18 NOTE — Patient Instructions (Addendum)
I suspect your pain is due to Plantar fasciitis. You had fluid under your plantar fascia on ultrasound.  Please continue to take Tylenol as needed for pain. You may also continue the Epsom salt soaks. I have sent a cream to the pharmacy for you. Please do the exercises in the handout given. Please ice your foot several times daily. You may place a can or bottle in the freezer and then roll it under your foot. I have placed a referral to Sports Medicine. Please bring your work shoes to this visit.  Dr. Gerlean Ren

## 2017-06-25 ENCOUNTER — Encounter: Payer: Self-pay | Admitting: Internal Medicine

## 2017-06-25 ENCOUNTER — Ambulatory Visit (HOSPITAL_COMMUNITY)
Admission: RE | Admit: 2017-06-25 | Discharge: 2017-06-25 | Disposition: A | Discharge status: Home / Self Care | Payer: BLUE CROSS/BLUE SHIELD | Source: Ambulatory Visit | Attending: Hematology and Oncology | Admitting: Hematology and Oncology

## 2017-06-25 ENCOUNTER — Ambulatory Visit (INDEPENDENT_AMBULATORY_CARE_PROVIDER_SITE_OTHER): Payer: BLUE CROSS/BLUE SHIELD | Admitting: Internal Medicine

## 2017-06-25 VITALS — BP 90/52 | HR 51 | Temp 98.4°F | Wt 170.0 lb

## 2017-06-25 DIAGNOSIS — R55 Syncope and collapse: Secondary | ICD-10-CM | POA: Diagnosis not present

## 2017-06-25 NOTE — Patient Instructions (Signed)
It was so nice to meet you!  I think a lot of your dizziness is caused by not drinking enough water. You should try to drink 64 ounces of water per day. I would also recommend cutting back on the coffee and tea that you are drinking each day.  I have checked some labs today. I will call you with these results.  -Dr. Brett Albino

## 2017-06-26 ENCOUNTER — Telehealth: Payer: Self-pay

## 2017-06-26 DIAGNOSIS — R55 Syncope and collapse: Secondary | ICD-10-CM | POA: Insufficient documentation

## 2017-06-26 LAB — COMPREHENSIVE METABOLIC PANEL
ALT: 17 IU/L (ref 0–32)
AST: 20 IU/L (ref 0–40)
Albumin/Globulin Ratio: 1.3 (ref 1.2–2.2)
Albumin: 3.9 g/dL (ref 3.5–5.5)
Alkaline Phosphatase: 88 IU/L (ref 39–117)
BUN/Creatinine Ratio: 22 (ref 9–23)
BUN: 15 mg/dL (ref 6–24)
Bilirubin Total: <0.2 mg/dL (ref 0.0–1.2)
CO2: 24 mmol/L (ref 20–29)
Calcium: 9.3 mg/dL (ref 8.7–10.2)
Chloride: 105 mmol/L (ref 96–106)
Creatinine, Ser: 0.69 mg/dL (ref 0.57–1.00)
GFR calc Af Amer: 119 mL/min/{1.73_m2} (ref >59)
GFR calc non Af Amer: 103 mL/min/{1.73_m2} (ref >59)
Globulin, Total: 3.1 g/dL (ref 1.5–4.5)
Glucose: 96 mg/dL (ref 65–99)
Potassium: 4.4 mmol/L (ref 3.5–5.2)
Sodium: 144 mmol/L (ref 134–144)
Total Protein: 7 g/dL (ref 6.0–8.5)

## 2017-06-26 LAB — CBC
Hematocrit: 38.7 % (ref 34.0–46.6)
Hemoglobin: 12.6 g/dL (ref 11.1–15.9)
MCH: 27.2 pg (ref 26.6–33.0)
MCHC: 32.6 g/dL (ref 31.5–35.7)
MCV: 83 fL (ref 79–97)
Platelets: 303 10*3/uL (ref 150–379)
RBC: 4.64 x10E6/uL (ref 3.77–5.28)
RDW: 14.8 % (ref 12.3–15.4)
WBC: 5.6 10*3/uL (ref 3.4–10.8)

## 2017-06-26 LAB — TSH: TSH: 1.57 u[IU]/mL (ref 0.450–4.500)

## 2017-06-26 NOTE — Progress Notes (Signed)
   [facility] Phone: [phone]  Subjective:  Carrie Shelton is a 46 year old female presenting to clinic with dizziness. She states she has had dizziness for the last 6 years where she feels like the room is spinning. Over the last few weeks, she has developed a new kind of dizziness where she feels like she is going to pass out. This can happen up to 2-3 times per day. The dizziness is worse when she bends over to pick something off the floor or when she goes from sitting to standing very quickly. The dizziness comes on all of the sudden. When it happens, she has to sit down right away because she feels like she is going to pass out. She denies any true passing out or LOC. No falls. During the dizzy spells, she notes occasional nausea and rapid heart beat. She denies any diarrhea or vomiting. She states that she does not drink any water. She only drinks coffee and Lipton black tea. She does not drink anything else. She has a good appetite and eats regular meals. She denies any chest pain, shortness of breath, lower extremity edema, polyuria, polydipsia. No vaginal bleeding or hematochezia. No recent fevers, feeling sick, rhinorrhea, sore throat, or cough.  ROS: See HPI for pertinent positives and negatives  Past Medical History- none  Family history reviewed for today's visit. No changes.  Social history- patient is a never smoker  Objective: BP (!) 90/52   Pulse (!) 51   Temp 98.4 F (36.9 C) (Oral)   Wt 170 lb (77.1 kg)   LMP 01/20/2017   SpO2 98%   BMI 33.20 kg/m  Gen: NAD, alert, cooperative with exam HEENT: NCAT, EOMI, MMM Neck: FROM, supple, no thyromegaly, no carotid bruits. CV: Bradycardic, regular rhythm, no murmur Resp: CTABL, no wheezes, normal work of breathing Msk: No edema, warm, normal tone, moves UE/LE spontaneously Neuro: Alert and oriented, no focal deficits, normal gait Skin: No rashes, no lesions Psych: Appropriate  behavior  Assessment/Plan: Presyncope: Patient with feeling like she is going to pass out with quickly standing for the last few weeks. Likely orthostatic hypotension in the setting of chronic dehydration, as patient does not drink any water during the day (only drinks coffee and black tea). Orthostatic vitals were positive today with an increase in HR from 61 while laying to 93 while standing. No recent vomiting/diarrhea that would contribute to dehydration. She is mildly hypotensive and bradycardic today, so concern for cardiac etiology. No bibasilar crackles or lower extremity edema to suggest new heart failure with decreased ejection fraction. EKG today without ST/T wave changes. Not on any blood pressure medications at home that may be contributing. Thyroid dysfunction is a possibility. Anemia is on the differential but less likely without vaginal bleeding or hematochezia. Infection seems less likely without infectious symptoms. Adrenal insufficiency is also a possibility, although patient does not think she has ever taken corticosteroids. - Orthostatics positive- encouraged patient to start drinking water. Goal to get up to 64oz of water per day. Can try adding Crystal Light packets to water to help flavor it. - EKG performed in clinic- no ST/T wave changes - Will check basic labs- CBC, CMP, TSH - Advised that patient should try optimizing fluid intake. If her symptoms persist, she will warrant further work-up. May need to consider ECHO and obtaining cortisol and aldosterone levels. - Follow-up with PCP in 2 weeks - Precepted with Dr. Cheron Every, MD PGY-3

## 2017-06-26 NOTE — Telephone Encounter (Signed)
Pts daughter contacted and informed of normal results. I encouraged more intake of fluids to help with her mothers sxs. Pts daughter voiced understanding.

## 2017-06-26 NOTE — Telephone Encounter (Signed)
-----   Message from Sela Hua, MD sent at 06/26/2017  3:46 PM EDT ----- Please let Ms. Laduca know that her labs were all completely normal. If she continues to have dizziness, she should be seen by her primary care doctor. Thanks!

## 2017-06-26 NOTE — Assessment & Plan Note (Signed)
Patient with feeling like she is going to pass out with quickly standing for the last few weeks. Likely orthostatic hypotension in the setting of chronic dehydration, as patient does not drink any water during the day (only drinks coffee and black tea). Orthostatic vitals were positive today with an increase in HR from 61 while laying to 93 while standing. No recent vomiting/diarrhea that would contribute to dehydration. She is mildly hypotensive and bradycardic today, so concern for cardiac etiology. No bibasilar crackles or lower extremity edema to suggest new heart failure with decreased ejection fraction. EKG today without ST/T wave changes. Not on any blood pressure medications at home that may be contributing. Thyroid dysfunction is a possibility. Anemia is on the differential but less likely without vaginal bleeding or hematochezia. Infection seems less likely without infectious symptoms. Adrenal insufficiency is also a possibility, although patient does not think she has ever taken corticosteroids. - Orthostatics positive- encouraged patient to start drinking water. Goal to get up to 64oz of water per day. Can try adding Crystal Light packets to water to help flavor it. - EKG performed in clinic- no ST/T wave changes - Will check basic labs- CBC, CMP, TSH - Advised that patient should try optimizing fluid intake. If her symptoms persist, she will warrant further work-up. May need to consider ECHO and obtaining cortisol and aldosterone levels. - Follow-up with PCP in 2 weeks - Precepted with Dr. Ardelia Mems

## 2017-10-08 ENCOUNTER — Encounter: Payer: Self-pay | Admitting: Internal Medicine

## 2017-10-08 ENCOUNTER — Ambulatory Visit: Payer: BLUE CROSS/BLUE SHIELD | Admitting: Internal Medicine

## 2017-10-08 VITALS — Ht 60.0 in | Wt 172.0 lb

## 2017-10-08 DIAGNOSIS — G8929 Other chronic pain: Secondary | ICD-10-CM

## 2017-10-08 DIAGNOSIS — M5441 Lumbago with sciatica, right side: Secondary | ICD-10-CM | POA: Diagnosis not present

## 2017-10-08 DIAGNOSIS — M545 Low back pain, unspecified: Secondary | ICD-10-CM | POA: Insufficient documentation

## 2017-10-08 MED ORDER — KETOROLAC TROMETHAMINE 60 MG/2ML IM SOLN
60.0000 mg | Freq: Once | INTRAMUSCULAR | Status: AC
  Administered 2017-10-08: 60 mg via INTRAMUSCULAR

## 2017-10-08 MED ORDER — CYCLOBENZAPRINE HCL 5 MG PO TABS: 5.0000 mg | ORAL_TABLET | Freq: Three times a day (TID) | ORAL | 0 refills | Status: AC | PRN

## 2017-10-08 MED ORDER — METHYLPREDNISOLONE ACETATE 40 MG/ML IJ SUSP
40.0000 mg | Freq: Once | INTRAMUSCULAR | Status: AC
  Administered 2017-10-08: 40 mg via INTRAMUSCULAR

## 2017-10-08 NOTE — Patient Instructions (Signed)
It was so nice to see you!  We have given you two anti-inflammatory shots today to help with the inflammation and pain. I have also prescribed a muscle relaxer called Flexeril. You can take this three times per day as needed.  I have given you a handout on some exercises that you can do at home to help your back. You should also use heat to the back throughout the day.  -Dr. Brett Albino

## 2017-10-08 NOTE — Progress Notes (Signed)
   [facility] Phone: [phone]  Subjective:  Carrie Shelton is a 47 year old female presenting to clinic with right sided low back pain. She has had low back pain for many years, but her back pain acutely worsened yesterday. She works as a Clinical research associate at [employer] and yesterday she was lifting heavier objects than she normally does. She was exhausted after work and then her back started hurting. The pain is "sharp". The pain radiates down the back of her right leg. The pain in her back bothers her more than the pain in her leg. She tried Bengay, heat, and an OTC pain reliever (unsure of which one) at home with some relief. The pain is worse with walking. She denies any numbness, tingling, or weakness in her legs. No fevers.   ROS: See HPI for pertinent positives and negatives  Past Medical History- non-contributory  Family history reviewed for today's visit. No changes.  Social history- patient is a never smoker  Objective: Ht 5' (1.524 m)   Wt 172 lb (78 kg)   LMP 07/08/2017 (Approximate)   BMI 33.59 kg/m  BP 110/62 Gen: NAD, alert, cooperative with exam Resp: Normal work of breathing Back: no midline tenderness, +ttp over the right paraspinal muscles, decreased ROM in all directions due to pain, midline lipoma noted over the sacral area Neuro: Alert and oriented, no gross deficit, 5/5 strength in lower extremities bilaterally, sensation intact to light touch throughout, reflexes blunted but symmetric, straight leg raise negative bilaterally. Skin: No rashes, no lesions Psych: Appropriate behavior  Assessment/Plan: Right Sided Back Pain with Right Sided Sciatica: Has chronic low back pain, but yesterday had increased pain with new sciatica after lifting heavier than normal at work. No back pain red flags. Neuro exam including straight leg raise was unremarkable. - Patient given Toradol 60mg  IM and Depo-medrol 40mg  IM in clinic today for acute pain - Flexeril  prescribed tid prn, as patient states her back pain is worse than her radiating pain. If this doesn't help, could try Gabapentin - Patient given home rehabilitation exercises - Can also use heat/ice to the area - Discussed that this may take up to 3 months to get better - Patient and daughter requesting referral to orthopedic surgery. Referral placed. Will defer imaging to them, although do not think patient needs imaging at this time.  Hyman Bible, MD PGY-3

## 2017-10-08 NOTE — Assessment & Plan Note (Signed)
Has chronic low back pain, but yesterday had increased pain with new sciatica after lifting heavier than normal at work. No back pain red flags. Neuro exam including straight leg raise was unremarkable. - Patient given Toradol 60mg  IM and Depo-medrol 40mg  IM in clinic today for acute pain - Flexeril prescribed tid prn, as patient states her back pain is worse than her radiating pain. If this doesn't help, could try Gabapentin - Patient given home rehabilitation exercises - Can also use heat/ice to the area - Discussed that this may take up to 3 months to get better - Patient and daughter requesting referral to orthopedic surgery. Referral placed. Will defer imaging to them, although do not think patient needs imaging at this time.

## 2017-10-15 DIAGNOSIS — M545 Low back pain: Secondary | ICD-10-CM | POA: Diagnosis not present

## 2017-10-27 ENCOUNTER — Ambulatory Visit (INDEPENDENT_AMBULATORY_CARE_PROVIDER_SITE_OTHER): Payer: Self-pay | Admitting: Orthopaedic Surgery

## 2018-01-07 ENCOUNTER — Ambulatory Visit: Payer: BLUE CROSS/BLUE SHIELD | Admitting: Internal Medicine

## 2018-01-07 ENCOUNTER — Other Ambulatory Visit: Payer: Self-pay | Admitting: *Deleted

## 2018-01-07 MED ORDER — NAPHAZOLINE-PHENIRAMINE 0.025-0.3 % OP SOLN: 1.0000 [drp] | Freq: Four times a day (QID) | OPHTHALMIC | 0 refills | Status: AC | PRN

## 2018-01-22 ENCOUNTER — Ambulatory Visit: Payer: BLUE CROSS/BLUE SHIELD | Admitting: Family Medicine

## 2018-01-22 ENCOUNTER — Other Ambulatory Visit: Payer: Self-pay

## 2018-01-22 ENCOUNTER — Encounter: Payer: Self-pay | Admitting: Family Medicine

## 2018-01-22 VITALS — BP 98/61 | HR 61 | Temp 98.4°F | Wt 169.0 lb

## 2018-01-22 DIAGNOSIS — H539 Unspecified visual disturbance: Secondary | ICD-10-CM

## 2018-01-22 DIAGNOSIS — J302 Other seasonal allergic rhinitis: Secondary | ICD-10-CM | POA: Insufficient documentation

## 2018-01-22 DIAGNOSIS — R55 Syncope and collapse: Secondary | ICD-10-CM

## 2018-01-22 MED ORDER — FLUTICASONE PROPIONATE 50 MCG/ACT NA SUSP: 2.0000 | Freq: Every day | NASAL | 2 refills | Status: AC

## 2018-01-22 NOTE — Assessment & Plan Note (Signed)
Will prescribe Flonase for itchy eyes.

## 2018-01-22 NOTE — Patient Instructions (Addendum)
It was nice meeting you today Ms. Berenguer!  For your itchy eyes, let's try Flonase two sprays in each nostril daily.  For your vision changes and dizziness, I am referring you to an eye doctor.  We also checked your vision and your blood pressure today, which were normal.  Please call our clinic if you feel like the room is spinning and will not stop.  Please also call the clinic if you have any spells where you pass out.  If you have any questions or concerns, please feel free to call the clinic.   Be well,  Dr. Shan Levans

## 2018-01-22 NOTE — Assessment & Plan Note (Signed)
Differential is broad and includes orthostatic hypotension, migraine, psychogenic dizziness, Meniere's, BPPV, anemia, TIA, vestibular neuritis, among others.  Patient does not appear anemic on exam and is taking no medications that would cause dizziness.  Her orthostatics were negative.  Will also refer to optometry since patient's vision is impaired, and she has not gotten an eye exam in six years.  I am reassured that patient's symptoms are not concerning for a life-threatening cause of dizziness today.  Return precautions were given, including syncope or unresolved vertigo.

## 2018-01-22 NOTE — Progress Notes (Signed)
   Subjective:    Carrie Shelton - 47 y.o. female MRN [id number]  Date of birth: 02/25/1971  HPI  Carrie Shelton is here for itchy eyes and intermittent dizziness.  She says she is more concerned about the dizziness than the itchiness, since she often feels like she will pass out.  Her dizzy spells are intermittent, unprovoked, and not associated with position changes or periods of fasting.  She denies syncope.  She says her vision will black out, she will feel eye pain, and she will need to hold onto something to prevent her from falling.  She says that sometimes the room will spin around her, but sometimes it will not.  The last time she got an eye exam was six years ago.    Health Maintenance:  Health Maintenance Due  Topic Date Due  . HIV Screening  09/23/1983  . TETANUS/TDAP  09/23/1987    -  reports that she has never smoked. She has never used smokeless tobacco. - Review of Systems: Per HPI. - Past Medical History: Patient Active Problem List   Diagnosis Date Noted  . Vision changes 01/22/2018  . Seasonal allergies 01/22/2018  . Chronic right-sided low back pain with right-sided sciatica 10/08/2017  . Pre-syncope 06/26/2017  . Health care maintenance 10/24/2016  . Lipoma 09/05/2013   - Medications: reviewed and updated   Objective:   Physical Exam BP 98/61   Pulse 61   Temp 98.4 F (36.9 C) (Oral)   Wt 169 lb (76.7 kg)   LMP 10/23/2017   SpO2 99%   BMI 33.01 kg/m  Gen: NAD, alert, cooperative with exam, well-appearing HEENT: NCAT, PERRL, clear conjunctiva, supple neck CV: RRR, good S1/S2, no murmur, no edema, capillary refill brisk, palms appear pink Resp: CTABL, no wheezes, non-labored Abd: SNTND, BS present, no guarding or organomegaly Skin: no rashes, normal turgor  Neuro: no gross deficits.  Psych: good insight, alert and oriented  Orthostatics: lying: 100/60, HR 60, sitting: 100/60, HR 61, standing: 102/64, HR 66 Vision: 20/50 bilaterally       Assessment & Plan:   Pre-syncope Differential is broad and includes orthostatic hypotension, migraine, psychogenic dizziness, Meniere's, BPPV, anemia, TIA, vestibular neuritis, among others.  Patient does not appear anemic on exam and is taking no medications that would cause dizziness.  Her orthostatics were negative.  Will also refer to optometry since patient's vision is impaired, and she has not gotten an eye exam in six years.  I am reassured that patient's symptoms are not concerning for a life-threatening cause of dizziness today.  Return precautions were given, including syncope or unresolved vertigo.    Seasonal allergies Will prescribe Flonase for itchy eyes.    Maia Breslow, M.D. 01/22/2018, 11:52 AM PGY-1, [place]

## 2018-03-01 ENCOUNTER — Ambulatory Visit: Payer: BLUE CROSS/BLUE SHIELD

## 2018-05-18 ENCOUNTER — Ambulatory Visit: Payer: BLUE CROSS/BLUE SHIELD | Admitting: Family Medicine

## 2018-05-19 ENCOUNTER — Other Ambulatory Visit: Payer: Self-pay

## 2018-05-19 ENCOUNTER — Ambulatory Visit: Payer: BLUE CROSS/BLUE SHIELD | Admitting: Family Medicine

## 2018-05-19 VITALS — BP 98/62 | HR 68 | Temp 97.7°F | Wt 173.0 lb

## 2018-05-19 DIAGNOSIS — R5383 Other fatigue: Secondary | ICD-10-CM

## 2018-05-19 DIAGNOSIS — D1779 Benign lipomatous neoplasm of other sites: Secondary | ICD-10-CM

## 2018-05-19 NOTE — Patient Instructions (Addendum)
  Good to see you today!  We'll check labs today to make sure you don't have low blood counts or a thyroid issue going on.   I've referred you back to Dr. Brantley Stage for the lipoma removal. Please let us know if you do not hear from them to make an appointment.   If you have questions or concerns please do not hesitate to call at [phone].  Lucila Maine, DO PGY-3, [facility] 05/19/2018 8:58 AM

## 2018-05-19 NOTE — Assessment & Plan Note (Addendum)
  Referral made back to surgeon as this was 4 years ago for removal. Lipoma appears to have gotten bigger based off his measurements from note in 2015 and is bothersome to her. She would benefit from removal at this point.

## 2018-05-19 NOTE — Progress Notes (Signed)
    Subjective:    Patient ID: Carrie Shelton, female    DOB: [birth year], 47 y.o.   MRN: [id number]   CC: fatigue  HPI: patient endorsing fatigue for the past "20 days" since returning from a 6 week trip to [place]. Initially family thought she was jet-lagged but fatigue persisted. She reports she works second shift and typically goes to bed around 12:30. She will wake up at 5 am and struggle to sleep more but lays in bed for a few more hours. The second shift is relatively new for her, she has been doing this for the past 5 months or so. She reports feeling not well rested in the morning. No history of snoring. She does not nap during the day. She denies feelings of depression or anxiety.   She works at an [employer]. She is having pain in her shoulders and upper back from lifting. 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  She also feels the lipoma on the small of her back is becoming more painful and bothersome to her. She has seen a Psychologist, sport and exercise in the past who said they would remove it if it began to bother her but this was 4 years ago.  Smoking status reviewed- non-smoker  Review of Systems- denies recent illnesses, weight loss, decreased appetite, headaches, blood in urine or stool    Objective:  BP 98/62   Pulse 68   Temp 97.7 F (36.5 C) (Oral)   Wt 173 lb (78.5 kg)   SpO2 98%   BMI 33.79 kg/m  Vitals and nursing note reviewed  General: well appearing, well nourished, in no acute distress HEENT: normocephalic, MMM Neck: supple, non-tender, without lymphadenopathy or thyromegaly Cardiac: RRR, clear S1 and S2, no murmurs, rubs, or gallops Respiratory: clear to auscultation bilaterally, no increased work of breathing Skin: warm and dry, no rashes noted. 7cm x 5.5 cm soft fatty mass at base of lumbar spine, tender to palpation Neuro: alert and oriented, no focal deficits  Assessment &  Plan:    Lipoma  Referral made back to surgeon as this was 4 years ago for removal. Lipoma appears to have gotten bigger based off his measurements from note in 2015 and is bothersome to her. She would benefit from removal at this point.   Fatigue  Patient with fatigue over the past 3 weeks since returning from trip to [place]. Likely secondary to insufficient sleep, patient reports about 5 hours of sleep a night. Denies mood issues. Will r/o anemia or thyroid disorder with labs. Work note given for accommodations to avoid lifting at job, hopefully this will help decrease muscle aches and allow for more restful sleep. Follow up 1 month for recheck.     Return in about 4 weeks (around 06/16/2018).   Lucila Maine, DO Family Medicine Resident PGY-3

## 2018-05-19 NOTE — Assessment & Plan Note (Signed)
  Patient with fatigue over the past 3 weeks since returning from trip to [place]. Likely secondary to insufficient sleep, patient reports about 5 hours of sleep a night. Denies mood issues. Will r/o anemia or thyroid disorder with labs. Work note given for accommodations to avoid lifting at job, hopefully this will help decrease muscle aches and allow for more restful sleep. Follow up 1 month for recheck.

## 2018-05-20 ENCOUNTER — Telehealth: Payer: Self-pay

## 2018-05-20 LAB — CBC
Hematocrit: 40.4 % (ref 34.0–46.6)
Hemoglobin: 12.7 g/dL (ref 11.1–15.9)
MCH: 27 pg (ref 26.6–33.0)
MCHC: 31.4 g/dL — ABNORMAL LOW (ref 31.5–35.7)
MCV: 86 fL (ref 79–97)
Platelets: 346 10*3/uL (ref 150–450)
RBC: 4.71 x10E6/uL (ref 3.77–5.28)
RDW: 14.7 % (ref 12.3–15.4)
WBC: 4.4 10*3/uL (ref 3.4–10.8)

## 2018-05-20 LAB — TSH: TSH: 2.28 u[IU]/mL (ref 0.450–4.500)

## 2018-05-20 NOTE — Telephone Encounter (Signed)
Attempted to contact pt to inform of results, she did not answer and no option for VM. If she call back, please inform her of normal lab work.

## 2018-05-20 NOTE — Progress Notes (Signed)
Please let Carrie Shelton (or her daughter who translates for her) that her labs for blood counts and thyroid were normal.

## 2018-05-20 NOTE — Telephone Encounter (Signed)
-----   Message from Steve Rattler, DO sent at 05/20/2018  2:24 PM EDT ----- Please let Ms. Dinino (or her daughter who translates for her) that her labs for blood counts and thyroid were normal.

## 2018-06-11 NOTE — Telephone Encounter (Signed)
Daughter called back, results given. Danley Danker, RN [facility] RN)

## 2018-07-16 ENCOUNTER — Ambulatory Visit: Payer: BLUE CROSS/BLUE SHIELD | Admitting: Family Medicine

## 2018-08-18 ENCOUNTER — Ambulatory Visit: Payer: BLUE CROSS/BLUE SHIELD | Admitting: Family Medicine

## 2018-08-18 ENCOUNTER — Other Ambulatory Visit: Payer: Self-pay

## 2018-08-18 VITALS — BP 102/62 | HR 64 | Temp 98.1°F | Wt 172.0 lb

## 2018-08-18 DIAGNOSIS — M5441 Lumbago with sciatica, right side: Secondary | ICD-10-CM

## 2018-08-18 DIAGNOSIS — G8929 Other chronic pain: Secondary | ICD-10-CM

## 2018-08-18 NOTE — Progress Notes (Signed)
    Subjective:  Carrie Shelton is a 47 y.o. female who presents to the [facility] today with a chief complaint of back pain.  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 Daughter as Optometrist.  HPI:  Patient states that she has had long-standing chronic low back pain on the right.  She flared up her low back pain yesterday at work when she was carrying heavy items.  She states that the back pain is consistent with her chronic pain, it radiates down to her her foot.  She has no bowel or bladder incontinence.  She has no weakness or numbness. They state that they have never been called about her previous orthopedics referral, they plan to go to [place]. She has been taking one Aleve as needed. She has not found home exercises to be helpful in the past  ROS: Per HPI   Objective:  Physical Exam: BP 102/62   Pulse 64   Temp 98.1 F (36.7 C) (Oral)   Wt 172 lb (78 kg)   SpO2 99%   BMI 33.59 kg/m   Gen: NAD, resting comfortably Pulm: NWOB MSK: Tender to palpation over right lumbar paraspinals without erythema or bogginess.  Negative straight leg test.  Lower extremities with 5 out of 5 strength bilaterally.  Deep tendon reflexes 2+ in bilateral patellar.  Skin: warm, dry Neuro: grossly normal, moves all extremities, normal gait Psych: Normal affect and thought content   Assessment/Plan:  Chronic right-sided low back pain with right-sided sciatica Patient has long-standing chronic low back pain and an acute flare after lifting heavy items at work.  No red flags on history or exam.  Discussed taking scheduled Aleve twice a day.  Patient declined Depo-Medrol injection today, she also declined an oral prednisone taper.  She is agreeable to physical therapy.  Referral placed today.  Orthopedics referral to [facility] [place] was placed today.  Daughter voiced good understanding that if she does not hear back about the referral after 1 to 2 weeks that she should call to investigate the  status of the referral.   Bufford Lope, DO PGY-3, [facility] 08/18/2018 8:51 AM

## 2018-08-18 NOTE — Patient Instructions (Addendum)
Aleve is good for antiinflammatory.   Referred to physical therapy and [facility] today.    Low Back Sprain Rehab Ask your health care provider which exercises are safe for you. Do exercises exactly as told by your health care provider and adjust them as directed. It is normal to feel mild stretching, pulling, tightness, or discomfort as you do these exercises, but you should stop right away if you feel sudden pain or your pain gets worse. Do not begin these exercises until told by your health care provider. Stretching and range of motion exercises These exercises warm up your muscles and joints and improve the movement and flexibility of your back. These exercises also help to relieve pain, numbness, and tingling. Exercise A: Lumbar rotation  1. Lie on your back on a firm surface and bend your knees. 2. Straighten your arms out to your sides so each arm forms an "L" shape with a side of your body (a 90 degree angle). 3. Slowly move both of your knees to one side of your body until you feel a stretch in your lower back. Try not to let your shoulders move off of the floor. 4. Hold for __________ seconds. 5. Tense your abdominal muscles and slowly move your knees back to the starting position. 6. Repeat this exercise on the other side of your body. Repeat __________ times. Complete this exercise __________ times a day. Exercise B: Prone extension on elbows  1. Lie on your abdomen on a firm surface. 2. Prop yourself up on your elbows. 3. Use your arms to help lift your chest up until you feel a gentle stretch in your abdomen and your lower back. ? This will place some of your body weight on your elbows. If this is uncomfortable, try stacking pillows under your chest. ? Your hips should stay down, against the surface that you are lying on. Keep your hip and back muscles relaxed. 4. Hold for __________ seconds. 5. Slowly relax your upper body and return to the starting  position. Repeat __________ times. Complete this exercise __________ times a day. Strengthening exercises These exercises build strength and endurance in your back. Endurance is the ability to use your muscles for a long time, even after they get tired. Exercise C: Pelvic tilt 1. Lie on your back on a firm surface. Bend your knees and keep your feet flat. 2. Tense your abdominal muscles. Tip your pelvis up toward the ceiling and flatten your lower back into the floor. ? To help with this exercise, you may place a small towel under your lower back and try to push your back into the towel. 3. Hold for __________ seconds. 4. Let your muscles relax completely before you repeat this exercise. Repeat __________ times. Complete this exercise __________ times a day. Exercise D: Alternating arm and leg raises  1. Get on your hands and knees on a firm surface. If you are on a hard floor, you may want to use padding to cushion your knees, such as an exercise mat. 2. Line up your arms and legs. Your hands should be below your shoulders, and your knees should be below your hips. 3. Lift your left leg behind you. At the same time, raise your right arm and straighten it in front of you. ? Do not lift your leg higher than your hip. ? Do not lift your arm higher than your shoulder. ? Keep your abdominal and back muscles tight. ? Keep your hips facing the ground. ?  Do not arch your back. ? Keep your balance carefully, and do not hold your breath. 4. Hold for __________ seconds. 5. Slowly return to the starting position and repeat with your right leg and your left arm. Repeat __________ times. Complete this exercise __________ times a day. Exercise E: Abdominal set with straight leg raise  1. Lie on your back on a firm surface. 2. Bend one of your knees and keep your other leg straight. 3. Tense your abdominal muscles and lift your straight leg up, 4-6 inches (10-15 cm) off the ground. 4. Keep your  abdominal muscles tight and hold for __________ seconds. ? Do not hold your breath. ? Do not arch your back. Keep it flat against the ground. 5. Keep your abdominal muscles tense as you slowly lower your leg back to the starting position. 6. Repeat with your other leg. Repeat __________ times. Complete this exercise __________ times a day. Posture and body mechanics  Body mechanics refers to the movements and positions of your body while you do your daily activities. Posture is part of body mechanics. Good posture and healthy body mechanics can help to relieve stress in your body's tissues and joints. Good posture means that your spine is in its natural S-curve position (your spine is neutral), your shoulders are pulled back slightly, and your head is not tipped forward. The following are general guidelines for applying improved posture and body mechanics to your everyday activities. Standing   When standing, keep your spine neutral and your feet about hip-width apart. Keep a slight bend in your knees. Your ears, shoulders, and hips should line up.  When you do a task in which you stand in one place for a long time, place one foot up on a stable object that is 2-4 inches (5-10 cm) high, such as a footstool. This helps keep your spine neutral. Sitting   When sitting, keep your spine neutral and keep your feet flat on the floor. Use a footrest, if necessary, and keep your thighs parallel to the floor. Avoid rounding your shoulders, and avoid tilting your head forward.  When working at a desk or a computer, keep your desk at a height where your hands are slightly lower than your elbows. Slide your chair under your desk so you are close enough to maintain good posture.  When working at a computer, place your monitor at a height where you are looking straight ahead and you do not have to tilt your head forward or downward to look at the screen. Resting   When lying down and resting, avoid  positions that are most painful for you.  If you have pain with activities such as sitting, bending, stooping, or squatting (flexion-based activities), lie in a position in which your body does not bend very much. For example, avoid curling up on your side with your arms and knees near your chest (fetal position).  If you have pain with activities such as standing for a long time or reaching with your arms (extension-based activities), lie with your spine in a neutral position and bend your knees slightly. Try the following positions:  Lying on your side with a pillow between your knees.  Lying on your back with a pillow under your knees. Lifting   When lifting objects, keep your feet at least shoulder-width apart and tighten your abdominal muscles.  Bend your knees and hips and keep your spine neutral. It is important to lift using the strength of your legs, not  your back. Do not lock your knees straight out.  Always ask for help to lift heavy or awkward objects. This information is not intended to replace advice given to you by your health care provider. Make sure you discuss any questions you have with your health care provider. Document Released: 09/08/2005 Document Revised: 05/15/2016 Document Reviewed: 06/20/2015 Elsevier Interactive Patient Education  [employer].

## 2018-08-18 NOTE — Assessment & Plan Note (Signed)
Patient has long-standing chronic low back pain and an acute flare after lifting heavy items at work.  No red flags on history or exam.  Discussed taking scheduled Aleve twice a day.  Patient declined Depo-Medrol injection today, she also declined an oral prednisone taper.  She is agreeable to physical therapy.  Referral placed today.  Orthopedics referral to [place] was placed today.  Daughter voiced good understanding that if she does not hear back about the referral after 1 to 2 weeks that she should call to investigate the status of the referral.

## 2018-08-25 ENCOUNTER — Ambulatory Visit: Payer: BLUE CROSS/BLUE SHIELD | Admitting: Family Medicine

## 2018-09-20 DIAGNOSIS — M545 Low back pain: Secondary | ICD-10-CM | POA: Diagnosis not present

## 2018-09-20 DIAGNOSIS — D179 Benign lipomatous neoplasm, unspecified: Secondary | ICD-10-CM | POA: Diagnosis not present

## 2018-09-29 DIAGNOSIS — M545 Low back pain: Secondary | ICD-10-CM | POA: Diagnosis not present

## 2018-11-24 ENCOUNTER — Ambulatory Visit: Payer: Self-pay | Admitting: Family Medicine

## 2018-12-25 ENCOUNTER — Other Ambulatory Visit: Payer: Self-pay

## 2018-12-25 ENCOUNTER — Encounter (HOSPITAL_COMMUNITY): Payer: Self-pay | Admitting: Family Medicine

## 2018-12-25 ENCOUNTER — Ambulatory Visit (HOSPITAL_COMMUNITY)
Admission: EM | Admit: 2018-12-25 | Discharge: 2018-12-25 | Disposition: A | Discharge status: Home / Self Care | Payer: BLUE CROSS/BLUE SHIELD | Attending: Family Medicine | Admitting: Family Medicine

## 2018-12-25 DIAGNOSIS — J069 Acute upper respiratory infection, unspecified: Secondary | ICD-10-CM

## 2018-12-25 MED ORDER — IBUPROFEN 800 MG PO TABS: 800.0000 mg | ORAL_TABLET | Freq: Three times a day (TID) | ORAL | 0 refills | Status: AC

## 2018-12-25 MED ORDER — ACETAMINOPHEN 500 MG PO TABS: 500.0000 mg | ORAL_TABLET | Freq: Four times a day (QID) | ORAL | 0 refills | Status: AC | PRN

## 2018-12-25 MED ORDER — ACETAMINOPHEN 325 MG PO TABS
ORAL_TABLET | ORAL | Status: AC
  Filled 2018-12-25: qty 2

## 2018-12-25 MED ORDER — ACETAMINOPHEN 325 MG PO TABS
650.0000 mg | ORAL_TABLET | Freq: Once | ORAL | Status: AC
  Administered 2018-12-25: 650 mg via ORAL

## 2018-12-25 NOTE — ED Provider Notes (Signed)
[name]    CSN: [id number] Arrival date & time: 12/25/18  1406     History   Chief Complaint Chief Complaint  Patient presents with  . Fever  . Generalized Body Aches    HPI Carrie Shelton is a 48 y.o. female.   Carrie Shelton presents with complaints of headache off and on for a few days, fever, leg pain. Started Thursday evening, 4/2. Occasional cough and congestion throughout the day. No chest pain  Or shortness of breath . No sore throat no ear pain. No gi/gu complaints. No known ill contacts. No travel. Has been taking Aleve, tylenol and cold medication. 0400 this am took cold medications last. No asthma no copd. No smoker. She works at [employer], no others ill that she is aware of.  Patient's daughter provides arabic interpretation by phone per patient request.      Past Medical History:  Diagnosis Date  . Anemia   . Vertigo     Patient Active Problem List   Diagnosis Date Noted  . Fatigue 05/19/2018  . Vision changes 01/22/2018  . Seasonal allergies 01/22/2018  . Chronic right-sided low back pain with right-sided sciatica 10/08/2017  . Pre-syncope 06/26/2017  . Health care maintenance 10/24/2016  . Lipoma 09/05/2013    Past Surgical History:  Procedure Laterality Date  . CHOLECYSTECTOMY N/A 08/01/2014   Procedure: LAPAROSCOPIC CHOLECYSTECTOMY WITH INTRAOPERATIVE CHOLANGIOGRAM;  Surgeon: Erroll Luna, MD;  Location: [facility];  Service: General;  Laterality: N/A;  . DILATION AND CURETTAGE OF UTERUS     daughter is not really sure this is the only word she could remember    OB History   No obstetric history on file.      Home Medications    Prior to Admission medications   Medication Sig Start Date End Date Taking? Authorizing Provider  acetaminophen (TYLENOL) 500 MG tablet Take 1 tablet (500 mg total) by mouth every 6 (six) hours as needed. 12/25/18   Zigmund Gottron, NP  cyclobenzaprine (FLEXERIL) 5 MG tablet Take 1 tablet (5  mg total) by mouth 3 (three) times daily as needed for muscle spasms. Patient not taking: Reported on 12/25/2018 10/08/17   MayoPete Pelt, MD  fluticasone [facility]) 50 MCG/ACT nasal spray Place 2 sprays into both nostrils daily. 01/22/18   Kathrene Alu, MD  ibuprofen (ADVIL,MOTRIN) 800 MG tablet Take 1 tablet (800 mg total) by mouth 3 (three) times daily. 12/25/18   Zigmund Gottron, NP  Menthol, Topical Analgesic, 10 % GEL Apply to heel three times daily as needed for pain 03/18/17   Rumley, Frankclay N, DO  naphazoline-pheniramine (ALLERGY EYE) 0.025-0.3 % ophthalmic solution Place 1 drop into both eyes 4 (four) times daily as needed. 01/07/18   Rogue Bussing, MD  naproxen sodium (ALEVE) 220 MG tablet Take 220 mg by mouth 2 (two) times daily.    [provider]    Family History Family History  Problem Relation Age of Onset  . Healthy Daughter   . Healthy Daughter     Social History Social History   Tobacco Use  . Smoking status: Never Smoker  . Smokeless tobacco: Never Used  Substance Use Topics  . Alcohol use: No  . Drug use: No     Allergies   Morphine and related   Review of Systems Review of Systems   Physical Exam Triage Vital Signs ED Triage Vitals [12/25/18 1425]  Enc Vitals Group     BP  127/84     Pulse Rate (!) 108     Resp 18     Temp (!) 101 F (38.3 C)     Temp Source Oral     SpO2 100 %     Weight      Height      Head Circumference      Peak Flow      Pain Score 6     Pain Loc      Pain Edu?      Excl. in [place]?    No data found.  Updated Vital Signs BP 127/84 (BP Location: Left Arm)   Pulse (!) 108   Temp (!) 101 F (38.3 C) (Oral)   Resp 18   SpO2 100%   Visual Acuity Right Eye Distance:   Left Eye Distance:   Bilateral Distance:    Right Eye Near:   Left Eye Near:    Bilateral Near:     Physical Exam Vitals signs reviewed.  Constitutional:      Appearance: Normal appearance.  HENT:     Head: Normocephalic  and atraumatic.  Cardiovascular:     Rate and Rhythm: Normal rate.  Pulmonary:     Effort: Pulmonary effort is normal. No respiratory distress.  Neurological:     General: No focal deficit present.     Mental Status: She is alert and oriented to person, place, and time.  Psychiatric:        Mood and Affect: Mood normal.        Behavior: Behavior normal.    Limited physical contact with exam due to high suspicion for Covid-19.  Patient non distress, no labored breathing, speaking in complete sentences without difficulty. Vitals reviewed.   UC Treatments / Results  Labs (all labs ordered are listed, but only abnormal results are displayed) Labs Reviewed - No data to display  EKG None  Radiology No results found.  Procedures Procedures (including critical care time)  Medications Ordered in UC Medications  acetaminophen (TYLENOL) tablet 650 mg (650 mg Oral Given 12/25/18 1433)    Initial Impression / Assessment and Plan / UC Course  I have reviewed the triage vital signs and the nursing notes.  Pertinent labs & imaging results that were available during my care of the patient were reviewed by me and considered in my medical decision making (see chart for details).     Tachycardia, fever, slight cough. Has been working for a Building surveyor. Concern for Covid-19. Supportive cares and self quarantine recommended. Patient and daughter verbalized understanding and agreeable to plan. Ambulatory out of clinic without difficulty.    Final Clinical Impressions(s) / UC Diagnoses   Final diagnoses:  Upper respiratory tract infection, unspecified type     Discharge Instructions     Continue to alternate tylenol and ibuprofen/aleve (do not take both aleve and ibuprofen) to help with fever and headache. We provided tylenol at 2:30 this afternoon.  We are concerned about possible Covid-19 due to your symptoms.  This is treated supportively.  Rest, over the counter medications as  needed, plenty of fluids. Self quarantine to prevent any spread.  Quarantine for at least 7 days from onset of symptoms and for at least three days fever free without medications. Those who have been in close contact are at risk for symptoms, they do not need to be seen if develop symptoms unless feel they need to go to the ER for evaluation.  Please go to the ER  if develop worsening chest pain , shortness of breath , confusion, weakness, dehydration or otherwise worsening.     ED Prescriptions    Medication Sig Dispense Auth. Provider   ibuprofen (ADVIL,MOTRIN) 800 MG tablet Take 1 tablet (800 mg total) by mouth 3 (three) times daily. 21 tablet Augusto Gamble B, NP   acetaminophen (TYLENOL) 500 MG tablet Take 1 tablet (500 mg total) by mouth every 6 (six) hours as needed. 30 tablet Zigmund Gottron, NP     Controlled Substance Prescriptions [place] Controlled Substance Registry consulted? Not Applicable   Zigmund Gottron, NP 12/25/18 1503

## 2018-12-25 NOTE — Discharge Instructions (Addendum)
Continue to alternate tylenol and ibuprofen/aleve (do not take both aleve and ibuprofen) to help with fever and headache. We provided tylenol at 2:30 this afternoon.  We are concerned about possible Covid-19 due to your symptoms.  This is treated supportively.  Rest, over the counter medications as needed, plenty of fluids. Self quarantine to prevent any spread.  Quarantine for at least 7 days from onset of symptoms and for at least three days fever free without medications. Those who have been in close contact are at risk for symptoms, they do not need to be seen if develop symptoms unless feel they need to go to the ER for evaluation.  Please go to the ER if develop worsening chest pain , shortness of breath , confusion, weakness, dehydration or otherwise worsening.

## 2018-12-25 NOTE — ED Triage Notes (Signed)
Pt here for body aches and fever x 2 days

## 2019-04-27 ENCOUNTER — Telehealth: Payer: Self-pay | Admitting: Family Medicine

## 2019-04-27 ENCOUNTER — Other Ambulatory Visit: Payer: Self-pay

## 2019-09-30 ENCOUNTER — Encounter: Payer: Self-pay | Admitting: Student in an Organized Health Care Education/Training Program

## 2019-09-30 ENCOUNTER — Ambulatory Visit (INDEPENDENT_AMBULATORY_CARE_PROVIDER_SITE_OTHER): Payer: 59 | Admitting: Student in an Organized Health Care Education/Training Program

## 2019-09-30 ENCOUNTER — Other Ambulatory Visit: Payer: Self-pay

## 2019-09-30 ENCOUNTER — Encounter: Payer: Self-pay | Admitting: Physician Assistant

## 2019-09-30 VITALS — BP 102/60 | HR 75 | Ht 60.0 in | Wt 179.5 lb

## 2019-09-30 DIAGNOSIS — R5383 Other fatigue: Secondary | ICD-10-CM | POA: Diagnosis not present

## 2019-09-30 DIAGNOSIS — Z23 Encounter for immunization: Secondary | ICD-10-CM

## 2019-09-30 DIAGNOSIS — M7732 Calcaneal spur, left foot: Secondary | ICD-10-CM

## 2019-09-30 NOTE — Progress Notes (Addendum)
Subjective:    Patient ID: Carrie Shelton, female    DOB: 09-03-1971, 49 y.o.   MRN: [id number]  CC: continued foot pain  HPI:  Diagnosed with plantar fasciitis 02/2017. This pain is not the same.  Location of pain in Left heal. Has been present for approximately 4 months and has progressively gotten worse. Achy and sharp in nature. Pain worsened after standing for long periods of time at her job. Denies any known injury to the foot. No numbness or tingling. Contralateral foot unaffected. Has not tried any treatments.   Additionally, complains of fatigue for several months. No fevers or weight loss. Denies bleeding per rectum. No other ill symptoms. Has history of anemia and fatigue thought to be caused by poor sleep.  Smoking status reviewed   ROS: pertinent noted in the HPI   I have personally reviewed pertinent past medical history, surgical, family, and social history as appropriate. Objective:  BP 102/60   Pulse 75   Ht 5' (1.524 m)   Wt 179 lb 8 oz (81.4 kg)   SpO2 99%   BMI 35.06 kg/m   Vitals and nursing note reviewed  General: NAD, pleasant, able to participate in exam Extremities: no edema or cyanosis. Bilateral feet- strength symmetrical and intact bilaterally. Sensation intact bilaterally. Painless full range of motion. Acute tenderness to palpation over inferior calcaneous. Negative tenderness elsewhere.  Skin: warm and dry, no rashes noted Neuro: alert, no obvious focal deficits Psych: Normal affect and mood  [place]: exam of R heal unremarkable. L heal revealed bone spurs on plantar surface of calcaneous. No signs of inflammation of surrounding soft tissues including plantar fascia. Achilles tendon and retrocalcaneal space normal.   Assessment & Plan:   Inferior calcaneal bone spur History and [place] suggestive that foot pain from calcaneal bone spurs.  Recommended patient obtain heel inserts with center cutout where pain in heel is in order to relieve pressure on the  bone spurs.  If they are not able to find a device that works for them OTC, I recommended they let me know and I will send referral to sports medicine clinic to be fitted as we do not have any shoe inserts at our office.   Flu vaccine need Given flu vaccine  Fatigue Nondescript fatigue with multiple possible causes, negative B symptoms. Basic labs to rule out common causes. CBC, CMP, TSH, lipid normal. Recommended patient obtain her cancer screenings including mammogram and colonoscopy to r/o those as causes. Referral to GI and information for breast imaging center given.  Inform patient of normal results and request they come back in for further workup to include thorough evaluation of sleep/depression/other causes.   Orders Placed This Encounter  Procedures  . MM Digital Screening    Standing Status:   Future    Standing Expiration Date:   11/27/2020    Order Specific Question:   Reason for Exam (SYMPTOM  OR DIAGNOSIS REQUIRED)    Answer:   screening    Order Specific Question:   Is the patient pregnant?    Answer:   No    Order Specific Question:   Preferred imaging location?    Answer:   [facility]  . Flu Vaccine QUAD 36+ mos IM  . CBC  . Comprehensive metabolic panel  . TSH  . Lipid Panel  . Ambulatory referral to Gastroenterology    Referral Priority:   Routine    Referral Type:   Consultation    Referral Reason:  Specialty Services Required    Number of Visits Requested:   [place] PGY-2

## 2019-09-30 NOTE — Patient Instructions (Signed)
It was a pleasure to see you today!  To summarize our discussion for this visit:  Please sign up for mychart for better communication with your PCP. The instructions for enrollment are attached here.  We will collect blood today to assess for a reason for fatigue. I also recommend that you get your mammogram and colonoscopy screenings.   For your heel pain, you have bone spurs, I would recommend a heel insert into your shoes. The left heel insert should have a hole in the center where you are feeling pain to help relieve the pressure on that spur. If you are unable to find one that works for you at the store, please let me know so I can refer you to our sports medicine clinic to be fitted for one there.  Some additional health maintenance measures we should update are: Health Maintenance Due  Topic Date Due  . HIV Screening  09/23/1983  . TETANUS/TDAP  09/23/1987  . MAMMOGRAM  09/22/2018  . COLONOSCOPY  09/22/2018  . PAP SMEAR-Modifier  11/12/2019  .    Please return to our clinic to see me as needed.  Call the clinic at [phone] if your symptoms worsen or you have any concerns.   Thank you for allowing me to take part in your care,  Dr. Doristine Mango

## 2019-10-01 LAB — CBC
Hematocrit: 40.6 % (ref 34.0–46.6)
Hemoglobin: 13.1 g/dL (ref 11.1–15.9)
MCH: 26.8 pg (ref 26.6–33.0)
MCHC: 32.3 g/dL (ref 31.5–35.7)
MCV: 83 fL (ref 79–97)
Platelets: 337 10*3/uL (ref 150–450)
RBC: 4.89 x10E6/uL (ref 3.77–5.28)
RDW: 13.7 % (ref 11.7–15.4)
WBC: 4.6 10*3/uL (ref 3.4–10.8)

## 2019-10-01 LAB — TSH: TSH: 1.45 u[IU]/mL (ref 0.450–4.500)

## 2019-10-01 LAB — COMPREHENSIVE METABOLIC PANEL
ALT: 15 IU/L (ref 0–32)
AST: 19 IU/L (ref 0–40)
Albumin/Globulin Ratio: 1.4 (ref 1.2–2.2)
Albumin: 4 g/dL (ref 3.8–4.9)
Alkaline Phosphatase: 92 IU/L (ref 39–117)
BUN/Creatinine Ratio: 13 (ref 9–23)
BUN: 8 mg/dL (ref 6–24)
Bilirubin Total: 0.2 mg/dL (ref 0.0–1.2)
CO2: 23 mmol/L (ref 20–29)
Calcium: 9.4 mg/dL (ref 8.7–10.2)
Chloride: 102 mmol/L (ref 96–106)
Creatinine, Ser: 0.6 mg/dL (ref 0.57–1.00)
GFR calc Af Amer: 122 mL/min/{1.73_m2} (ref >59)
GFR calc non Af Amer: 106 mL/min/{1.73_m2} (ref >59)
Globulin, Total: 2.9 g/dL (ref 1.5–4.5)
Glucose: 92 mg/dL (ref 65–99)
Potassium: 4.1 mmol/L (ref 3.5–5.2)
Sodium: 138 mmol/L (ref 134–144)
Total Protein: 6.9 g/dL (ref 6.0–8.5)

## 2019-10-01 LAB — LIPID PANEL
Chol/HDL Ratio: 2.8 ratio (ref 0.0–4.4)
Cholesterol, Total: 157 mg/dL (ref 100–199)
HDL: 56 mg/dL (ref >39)
LDL Chol Calc (NIH): 78 mg/dL (ref 0–99)
Triglycerides: 130 mg/dL (ref 0–149)
VLDL Cholesterol Cal: 23 mg/dL (ref 5–40)

## 2019-10-03 DIAGNOSIS — Z23 Encounter for immunization: Secondary | ICD-10-CM | POA: Insufficient documentation

## 2019-10-03 DIAGNOSIS — M7730 Calcaneal spur, unspecified foot: Secondary | ICD-10-CM | POA: Insufficient documentation

## 2019-10-03 NOTE — Assessment & Plan Note (Signed)
Given flu vaccine

## 2019-10-03 NOTE — Assessment & Plan Note (Signed)
History and [place] suggestive that foot pain from calcaneal bone spurs.  Recommended patient obtain heel inserts with center cutout where pain in heel is in order to relieve pressure on the bone spurs.  If they are not able to find a device that works for them OTC, I recommended they let me know and I will send referral to sports medicine clinic to be fitted as we do not have any shoe inserts at our office.

## 2019-10-03 NOTE — Assessment & Plan Note (Addendum)
Nondescript fatigue with multiple possible causes, negative B symptoms, normal heart and lung exam. Basic labs to rule out common causes. CBC, CMP, TSH, lipid normal. Recommended patient obtain her cancer screenings including mammogram and colonoscopy to r/o those as causes. Referral to GI and information for breast imaging center given.  Inform patient of normal results and request they come back in for further workup to include thorough evaluation of sleep/depression/cardiac/other causes.

## 2019-10-03 NOTE — Progress Notes (Signed)
Please call to tell patient that her labs were all normal. If she would like a further workup for her fatigue, can she please set up another appointment with her PCP. thanks

## 2019-10-04 ENCOUNTER — Telehealth: Payer: Self-pay | Admitting: *Deleted

## 2019-10-04 NOTE — Telephone Encounter (Signed)
-----   Message from Richarda Osmond, DO sent at 10/03/2019  8:45 AM EST ----- Please call to tell patient that her labs were all normal. If she would like a further workup for her fatigue, can she please set up another appointment with her PCP. thanks

## 2019-10-04 NOTE — Telephone Encounter (Signed)
LMOVM using arabic interpreter rana [id number]. Cortny Bambach Kennon Holter, CMA

## 2019-10-12 ENCOUNTER — Ambulatory Visit: Payer: 59 | Admitting: Physician Assistant

## 2019-10-19 ENCOUNTER — Ambulatory Visit (INDEPENDENT_AMBULATORY_CARE_PROVIDER_SITE_OTHER): Payer: 59 | Admitting: Family Medicine

## 2019-10-19 ENCOUNTER — Emergency Department (HOSPITAL_COMMUNITY)
Admission: EM | Admit: 2019-10-19 | Discharge: 2019-10-19 | Disposition: A | Discharge status: Home / Self Care | Payer: 59 | Attending: Emergency Medicine | Admitting: Emergency Medicine

## 2019-10-19 ENCOUNTER — Emergency Department (HOSPITAL_BASED_OUTPATIENT_CLINIC_OR_DEPARTMENT_OTHER): Payer: 59

## 2019-10-19 ENCOUNTER — Other Ambulatory Visit: Payer: Self-pay

## 2019-10-19 DIAGNOSIS — M5442 Lumbago with sciatica, left side: Secondary | ICD-10-CM | POA: Insufficient documentation

## 2019-10-19 DIAGNOSIS — M5432 Sciatica, left side: Secondary | ICD-10-CM | POA: Diagnosis not present

## 2019-10-19 DIAGNOSIS — M79662 Pain in left lower leg: Secondary | ICD-10-CM | POA: Diagnosis present

## 2019-10-19 DIAGNOSIS — M7989 Other specified soft tissue disorders: Secondary | ICD-10-CM

## 2019-10-19 DIAGNOSIS — M5430 Sciatica, unspecified side: Secondary | ICD-10-CM | POA: Insufficient documentation

## 2019-10-19 DIAGNOSIS — M79605 Pain in left leg: Secondary | ICD-10-CM

## 2019-10-19 MED ORDER — CELECOXIB 200 MG PO CAPS: 200.0000 mg | ORAL_CAPSULE | Freq: Two times a day (BID) | ORAL | 0 refills | Status: AC

## 2019-10-19 MED ORDER — GABAPENTIN 300 MG PO CAPS: 300.0000 mg | ORAL_CAPSULE | Freq: Every day | ORAL | 0 refills | Status: AC

## 2019-10-19 MED ORDER — METHYLPREDNISOLONE 4 MG PO TBPK: ORAL_TABLET | ORAL | 0 refills | Status: AC

## 2019-10-19 MED ORDER — KETOROLAC TROMETHAMINE 60 MG/2ML IM SOLN
60.0000 mg | Freq: Once | INTRAMUSCULAR | Status: AC
  Administered 2019-10-19: 60 mg via INTRAMUSCULAR
  Filled 2019-10-19: qty 2

## 2019-10-19 MED ORDER — DEXAMETHASONE SODIUM PHOSPHATE 10 MG/ML IJ SOLN
10.0000 mg | Freq: Once | INTRAMUSCULAR | Status: AC
  Administered 2019-10-19: 10 mg via INTRAMUSCULAR
  Filled 2019-10-19: qty 1

## 2019-10-19 NOTE — ED Triage Notes (Signed)
Pt to ED from urgent care via EMS D/T concern for DVT. On assessment swelling noted to lower left leg. Pt c/o pain . Tender to touch, not hot to touch. Denies chest pain , denies shortness of breath. Reports symptoms started 5 days ago.

## 2019-10-19 NOTE — ED Provider Notes (Signed)
[place] [facility] EMERGENCY DEPARTMENT Provider Note   CSN: [id number] Arrival date & time: 10/19/19  1231     History Chief Complaint  Patient presents with  . Leg Pain    Left    Carrie Shelton is a 49 y.o. female with pmh chronic lower back pain and episodes of sciatica who has had 4 days of worsening left lower back pain radiating down the left leg into the left calf.  She is here because she felt like the leg was swollen and apparently measurements at triage showed that there was about a centimeter of difference.  Patient states that she denies weakness but pain is worse with ambulation.  She does have associated numbness.  She denies any saddle anesthesia, loss of bowel or bladder continence, fever, recent procedures to her back.  She did not take anything for the pain at home.  She has had no history of blood clots.  HPI     Past Medical History:  Diagnosis Date  . Anemia   . Vertigo     Patient Active Problem List   Diagnosis Date Noted  . Inferior calcaneal bone spur 10/03/2019  . Fatigue 05/19/2018  . Vision changes 01/22/2018  . Seasonal allergies 01/22/2018  . Chronic low back pain 10/08/2017  . Lipoma 09/05/2013    Past Surgical History:  Procedure Laterality Date  . CHOLECYSTECTOMY N/A 08/01/2014   Procedure: LAPAROSCOPIC CHOLECYSTECTOMY WITH INTRAOPERATIVE CHOLANGIOGRAM;  Surgeon: Erroll Luna, MD;  Location: [facility];  Service: General;  Laterality: N/A;  . DILATION AND CURETTAGE OF UTERUS     daughter is not really sure this is the only word she could remember     OB History   No obstetric history on file.     Family History  Problem Relation Age of Onset  . Healthy Daughter   . Healthy Daughter     Social History   Tobacco Use  . Smoking status: Never Smoker  . Smokeless tobacco: Never Used  Substance Use Topics  . Alcohol use: No  . Drug use: No    Home Medications Prior to Admission medications   Medication Sig Start  Date End Date Taking? Authorizing Provider  acetaminophen (TYLENOL) 500 MG tablet Take 1 tablet (500 mg total) by mouth every 6 (six) hours as needed. 12/25/18   Zigmund Gottron, NP  cyclobenzaprine (FLEXERIL) 5 MG tablet Take 1 tablet (5 mg total) by mouth 3 (three) times daily as needed for muscle spasms. Patient not taking: Reported on 12/25/2018 10/08/17   MayoPete Pelt, MD  fluticasone [facility]) 50 MCG/ACT nasal spray Place 2 sprays into both nostrils daily. 01/22/18   Kathrene Alu, MD  ibuprofen (ADVIL,MOTRIN) 800 MG tablet Take 1 tablet (800 mg total) by mouth 3 (three) times daily. 12/25/18   Zigmund Gottron, NP  Menthol, Topical Analgesic, 10 % GEL Apply to heel three times daily as needed for pain 03/18/17   Rumley, [place] N, DO  naphazoline-pheniramine (ALLERGY EYE) 0.025-0.3 % ophthalmic solution Place 1 drop into both eyes 4 (four) times daily as needed. 01/07/18   Rogue Bussing, MD  naproxen sodium (ALEVE) 220 MG tablet Take 220 mg by mouth 2 (two) times daily.    [provider]    Allergies    Morphine and related  Review of Systems   Review of Systems Ten systems reviewed and are negative for acute change, except as noted in the HPI.   Physical Exam Updated Vital  Signs BP 122/71 (BP Location: Left Arm)   Pulse 71   Temp 98.2 F (36.8 C) (Oral)   Resp 17   SpO2 99%   Physical Exam Vitals and nursing note reviewed.  Constitutional:      General: She is not in acute distress.    Appearance: She is well-developed. She is not diaphoretic.  HENT:     Head: Normocephalic and atraumatic.  Eyes:     General: No scleral icterus.    Conjunctiva/sclera: Conjunctivae normal.  Cardiovascular:     Rate and Rhythm: Normal rate and regular rhythm.     Heart sounds: Normal heart sounds. No murmur. No friction rub. No gallop.   Pulmonary:     Effort: Pulmonary effort is normal. No respiratory distress.     Breath sounds: Normal breath sounds.  Abdominal:      General: Bowel sounds are normal. There is no distension.     Palpations: Abdomen is soft. There is no mass.     Tenderness: There is no abdominal tenderness. There is no guarding.  Musculoskeletal:     Cervical back: Normal range of motion.     Comments: No obvious swelling of the left lower extremity as compared to the right.  Positive straight leg test on the left.  Normal DP and PT pulse bilaterally.  Skin:    General: Skin is warm and dry.  Neurological:     Mental Status: She is alert and oriented to person, place, and time.  Psychiatric:        Behavior: Behavior normal.     ED Results / Procedures / Treatments   Labs (all labs ordered are listed, but only abnormal results are displayed) Labs Reviewed - No data to display  EKG None  Radiology No results found.  Procedures Procedures (including critical care time)  Medications Ordered in ED Medications  dexamethasone (DECADRON) injection 10 mg (has no administration in time range)  ketorolac (TORADOL) injection 60 mg (has no administration in time range)    ED Course  I have reviewed the triage vital signs and the nursing notes.  Pertinent labs & imaging results that were available during my care of the patient were reviewed by me and considered in my medical decision making (see chart for details).    MDM Rules/Calculators/A&P                      49 year old female here with complaint of left lower extremity pain history of sciatica.  Exam findings consistent with diagnosis of sciatica.  I have reviewed the patient's vascular ultrasound which shows no evidence of DVT.  I do not see any significant difference in size of the left leg as compared to the right.  Patient has a 3 PM appointment with family medicine.  I discussed the diagnosis of sciatica.  She was given IM Toradol and Decadron here.  She will be discharged with a Medrol Dosepak and Celebrex for pain relief.  I discussed need for close outpatient  follow-up.  She is advised to go directly to her appointment.  Patient appears appropriate for discharge at this time. Final Clinical Impression(s) / ED Diagnoses Final diagnoses:  None    Rx / DC Orders ED Discharge Orders    None       Margarita Mail, PA-C 10/19/19 1545    Dorie Rank, MD 10/20/19 [phone]

## 2019-10-19 NOTE — Discharge Instructions (Addendum)
Your ultrasound was negative. You do not have a blood clot in your leg.  SEEK IMMEDIATE MEDICAL ATTENTION IF: New numbness, tingling, weakness, or problem with the use of your arms or legs.  Severe back pain not relieved with medications.  Change in bowel or bladder control.  Increasing pain in any areas of the body (such as chest or abdominal pain).  Shortness of breath, dizziness or fainting.  Nausea (feeling sick to your stomach), vomiting, fever, or sweats.

## 2019-10-19 NOTE — ED Notes (Signed)
Pt d/c home per MD order. Discharge summary reviewed with pt and pt daughter, off unit via [place]

## 2019-10-19 NOTE — Assessment & Plan Note (Signed)
5 day h/o acute sciatic pain of L lower back with no clear inciting event. Received toradol and decadron injections earlier today at [facility]. No findings or symptoms to concern for cauda equina syndrome or acute neural compromise. No swelling of LLE appreciated on exam today. LE doppler earlier today negative for DVT. Will provide gabapentin for additional pain relief, note provided for work. Follow up in one week. Emergency precautions provided, see AVS for details. Daughter and patient verbalized understanding.

## 2019-10-19 NOTE — ED Notes (Signed)
Pt transported to [facility] room #52 to obtain US.

## 2019-10-19 NOTE — Progress Notes (Signed)
  Subjective:   Patient ID: Carrie Shelton    DOB: 01-19-1971, 49 y.o. female   MRN: [id number]  Carrie Shelton is a 49 y.o. female with a history of chronic LBP with sciatica, seasonal allergies, calcaneal bone spur here for   BACK PAIN  Back pain began 5 days ago. Pain is described as sharp, stabbing. Patient has tried aleve, steroids and NSAIDs at [facility]. Pain starts at L lower back and radiates down L leg. History of trauma or injury: no  Prior history of similar pain: yes on R side but this is worse and more constant History of cancer:no Weak immune system:  no History of IV drug use: no History of steroid use: no  Symptoms Incontinence of bowel or bladder:  no Numbness of leg: sometimes, when she wakes up or sits for a long time Fever: no Rest or Night pain: keeps from going to sleep Weight Loss:  no Rash: no Reports felt she had swelling of LLE. Had [place] at [facility] that was negative for DVT.  Review of Systems:  Per HPI.  Medications and smoking status reviewed.  Objective:   BP 100/70   Pulse 63   Ht 5' (1.524 m)   Wt 178 lb (80.7 kg)   SpO2 98%   BMI 34.76 kg/m  Vitals and nursing note reviewed.  General: obese female, in no acute distress with non-toxic appearance Extremities: warm and well perfused, normal tone MSK: 5/5 strength in LE bilaterally with decreased effort 2/2 pain, antalgic gait. AROM decreased 2/2 pain. Pain with palpation over piriformis.  Neuro: Alert and oriented, speech normal. Deep reflexes of LE intact. Intact and symmetric sensation to light touch to LE bilaterally.  Assessment & Plan:   Sciatica 5 day h/o acute sciatic pain of L lower back with no clear inciting event. Received toradol and decadron injections earlier today at [facility]. No findings or symptoms to concern for cauda equina syndrome or acute neural compromise. No swelling of LLE appreciated on exam today. LE doppler earlier today negative for DVT. Will provide gabapentin for additional  pain relief, note provided for work. Follow up in one week. Emergency precautions provided, see AVS for details. Daughter and patient verbalized understanding.  No orders of the defined types were placed in this encounter.  Meds ordered this encounter  Medications  . gabapentin (NEURONTIN) 300 MG capsule    Sig: Take 1 capsule (300 mg total) by mouth at bedtime.    Dispense:  30 capsule    Refill:  0    Rory Percy, DO PGY-3, [facility] 10/19/2019 8:29 PM

## 2019-10-19 NOTE — Progress Notes (Signed)
Left lower extremity venous duplex completed.  Preliminary results can be found under CV proc under chart review.  10/19/2019 2:09 PM  Deneane Stifter, K., RDMS, RVT

## 2019-10-19 NOTE — Patient Instructions (Signed)
It was great to see you!  Our plans for today:  - Take the gabapentin at night as needed for pain. This can make you sleepy. - Come back next week for follow up. - If you develop fevers, numbness of the inside of your thighs, or loss of bowel or bladder control, go to the Emergency Department to be seen.   Take care and seek immediate care sooner if you develop any concerns.   Dr. [facility]

## 2019-11-02 ENCOUNTER — Emergency Department (HOSPITAL_COMMUNITY)
Admission: EM | Admit: 2019-11-02 | Discharge: 2019-11-02 | Disposition: A | Discharge status: Home / Self Care | Payer: 59 | Attending: Emergency Medicine | Admitting: Emergency Medicine

## 2019-11-02 ENCOUNTER — Other Ambulatory Visit: Payer: Self-pay

## 2019-11-02 ENCOUNTER — Emergency Department (HOSPITAL_COMMUNITY): Payer: 59

## 2019-11-02 ENCOUNTER — Ambulatory Visit (INDEPENDENT_AMBULATORY_CARE_PROVIDER_SITE_OTHER): Payer: 59 | Admitting: Family Medicine

## 2019-11-02 ENCOUNTER — Encounter (HOSPITAL_COMMUNITY): Payer: Self-pay | Admitting: Emergency Medicine

## 2019-11-02 VITALS — BP 132/70 | HR 72 | Wt 179.6 lb

## 2019-11-02 DIAGNOSIS — R29898 Other symptoms and signs involving the musculoskeletal system: Secondary | ICD-10-CM | POA: Diagnosis not present

## 2019-11-02 DIAGNOSIS — Z79899 Other long term (current) drug therapy: Secondary | ICD-10-CM | POA: Diagnosis not present

## 2019-11-02 DIAGNOSIS — G44209 Tension-type headache, unspecified, not intractable: Secondary | ICD-10-CM

## 2019-11-02 DIAGNOSIS — R531 Weakness: Secondary | ICD-10-CM | POA: Diagnosis present

## 2019-11-02 DIAGNOSIS — M5416 Radiculopathy, lumbar region: Secondary | ICD-10-CM | POA: Diagnosis not present

## 2019-11-02 LAB — URINALYSIS, ROUTINE W REFLEX MICROSCOPIC
Bilirubin Urine: NEGATIVE
Glucose, UA: NEGATIVE mg/dL
Hgb urine dipstick: NEGATIVE
Ketones, ur: NEGATIVE mg/dL
Nitrite: NEGATIVE
Protein, ur: NEGATIVE mg/dL
Specific Gravity, Urine: 1.014 (ref 1.005–1.030)
pH: 6 (ref 5.0–8.0)

## 2019-11-02 LAB — BASIC METABOLIC PANEL WITH GFR
Anion gap: 10 (ref 5–15)
BUN: 10 mg/dL (ref 6–20)
CO2: 24 mmol/L (ref 22–32)
Calcium: 8.9 mg/dL (ref 8.9–10.3)
Chloride: 105 mmol/L (ref 98–111)
Creatinine, Ser: 0.52 mg/dL (ref 0.44–1.00)
GFR calc Af Amer: >60 mL/min
GFR calc non Af Amer: >60 mL/min
Glucose, Bld: 94 mg/dL (ref 70–99)
Potassium: 4.1 mmol/L (ref 3.5–5.1)
Sodium: 139 mmol/L (ref 135–145)

## 2019-11-02 LAB — CBC
HCT: 41.2 % (ref 36.0–46.0)
Hemoglobin: 13.1 g/dL (ref 12.0–15.0)
MCH: 27.1 pg (ref 26.0–34.0)
MCHC: 31.8 g/dL (ref 30.0–36.0)
MCV: 85.3 fL (ref 80.0–100.0)
Platelets: 274 K/uL (ref 150–400)
RBC: 4.83 MIL/uL (ref 3.87–5.11)
RDW: 14.6 % (ref 11.5–15.5)
WBC: 5.2 K/uL (ref 4.0–10.5)
nRBC: 0 % (ref 0.0–0.2)

## 2019-11-02 MED ORDER — SODIUM CHLORIDE 0.9% FLUSH: 3.0000 mL | Freq: Once | INTRAVENOUS | Status: DC

## 2019-11-02 NOTE — Discharge Instructions (Addendum)
Contact your family practice doctor tomorrow so they can cancel the outpatient MRI.  MRI results provided.  Does show some left-sided narrowing around L3-L4.  Possibly could be responsible for the upper leg weakness.  No evidence of any neurological deficits to the lower leg.  Also referral information provided to neurosurgery.  Can call to set up an appointment who would discuss it with your primary care doctor first.  Since she is only had symptoms for less than 7 days as far as the neurological symptoms are concerned still could heal on its own completely.  Recommend rest and off feet is much as possible.  Work note provided.  Continue current medications.

## 2019-11-02 NOTE — ED Triage Notes (Signed)
Pt sent by doctor for evaluation of left leg weakness. Weakness started on Friday. No drift noted, pt ambulatory to wheelchair. No other neuro deficits noted.

## 2019-11-02 NOTE — Patient Instructions (Addendum)
Carrie Shelton,  It was lovely to meet you today. I am not sure what is causing your left leg weakness. We need to get imaging of your spine to see if there are any problems there causing your symptoms. I have ordered a lower back MRI spine. Please get this urgently. The clinic will arrange this for you and hopefully you can get it tomorrow. If your MRI is normal I will refer you to orthopedic surgery.   Please follow up with your PCP after your MRI spine. Please go to the ER urgently if you develop further weakness in your other leg/arms, speech changes, droop of your face etc.  Best wishes and take care,  Dr Joy Haegele

## 2019-11-02 NOTE — ED Provider Notes (Signed)
[facility] EMERGENCY DEPARTMENT Provider Note   CSN: [id number] Arrival date & time: 11/02/19  1713     History Chief Complaint  Patient presents with  . Weakness    Carrie Shelton is a 49 y.o. female.  Patient with a complaint of back pain for several weeks.  Patient seen recently in the emergency department on January 27 with what they felt was left-sided sciatica.  Patient seen by primary care today [facility].  Outpatient MRI arranged.  Patient starting on Friday about 6 days ago with pain with movement of the thigh area.  And presumed weakness of that area.  Patient denies any numbness to the top of the foot of the bottom of the foot or any weakness to the foot.  Patient denies any low back pain today.  Patient daughter reports that is difficult for her to walk but able to walk.  Seems to have weakness and/or pain to the left upper leg area.  No fall or trauma.        Past Medical History:  Diagnosis Date  . Anemia   . Vertigo     Patient Active Problem List   Diagnosis Date Noted  . Sciatica 10/19/2019  . Inferior calcaneal bone spur 10/03/2019  . Fatigue 05/19/2018  . Vision changes 01/22/2018  . Seasonal allergies 01/22/2018  . Chronic low back pain 10/08/2017  . Lipoma 09/05/2013    Past Surgical History:  Procedure Laterality Date  . CHOLECYSTECTOMY N/A 08/01/2014   Procedure: LAPAROSCOPIC CHOLECYSTECTOMY WITH INTRAOPERATIVE CHOLANGIOGRAM;  Surgeon: Erroll Luna, MD;  Location: [place];  Service: General;  Laterality: N/A;  . DILATION AND CURETTAGE OF UTERUS     daughter is not really sure this is the only word she could remember     OB History   No obstetric history on file.     Family History  Problem Relation Age of Onset  . Healthy Daughter   . Healthy Daughter     Social History   Tobacco Use  . Smoking status: Never Smoker  . Smokeless tobacco: Never Used  Substance Use Topics  . Alcohol use: No  .  Drug use: No    Home Medications Prior to Admission medications   Medication Sig Start Date End Date Taking? Authorizing Provider  acetaminophen (TYLENOL) 500 MG tablet Take 1 tablet (500 mg total) by mouth every 6 (six) hours as needed. 12/25/18   Zigmund Gottron, NP  celecoxib (CELEBREX) 200 MG capsule Take 1 capsule (200 mg total) by mouth 2 (two) times daily. 10/19/19   Margarita Mail, PA-C  cyclobenzaprine (FLEXERIL) 5 MG tablet Take 1 tablet (5 mg total) by mouth 3 (three) times daily as needed for muscle spasms. Patient not taking: Reported on 12/25/2018 10/08/17   MayoPete Pelt, MD  fluticasone [facility]) 50 MCG/ACT nasal spray Place 2 sprays into both nostrils daily. 01/22/18   Kathrene Alu, MD  gabapentin (NEURONTIN) 300 MG capsule Take 1 capsule (300 mg total) by mouth at bedtime. 10/19/19   Rory Percy, DO  ibuprofen (ADVIL,MOTRIN) 800 MG tablet Take 1 tablet (800 mg total) by mouth 3 (three) times daily. 12/25/18   Zigmund Gottron, NP  Menthol, Topical Analgesic, 10 % GEL Apply to heel three times daily as needed for pain 03/18/17   Lorna Few, DO  methylPREDNISolone (MEDROL DOSEPAK) 4 MG TBPK tablet Use as directed 10/19/19   Margarita Mail, PA-C  naphazoline-pheniramine (ALLERGY EYE) 0.025-0.3 % ophthalmic  solution Place 1 drop into both eyes 4 (four) times daily as needed. 01/07/18   Rogue Bussing, MD  naproxen sodium (ALEVE) 220 MG tablet Take 220 mg by mouth 2 (two) times daily.    [provider]    Allergies    Morphine and related  Review of Systems   Review of Systems  Constitutional: Negative for chills and fever.  HENT: Negative for rhinorrhea and sore throat.   Eyes: Negative for visual disturbance.  Respiratory: Negative for cough and shortness of breath.   Cardiovascular: Negative for chest pain and leg swelling.  Gastrointestinal: Negative for abdominal pain, diarrhea, nausea and vomiting.  Genitourinary: Negative for dysuria.    Musculoskeletal: Positive for back pain. Negative for neck pain.  Skin: Negative for rash.  Neurological: Positive for weakness. Negative for dizziness, light-headedness and headaches.  Hematological: Does not bruise/bleed easily.  Psychiatric/Behavioral: Negative for confusion.    Physical Exam Updated Vital Signs BP 110/90   Pulse 60   Temp 98.3 F (36.8 C)   Resp 16   SpO2 100%   Physical Exam Vitals and nursing note reviewed.  Constitutional:      General: She is not in acute distress.    Appearance: Normal appearance. She is well-developed.  HENT:     Head: Normocephalic and atraumatic.  Eyes:     Extraocular Movements: Extraocular movements intact.     Conjunctiva/sclera: Conjunctivae normal.     Pupils: Pupils are equal, round, and reactive to light.  Cardiovascular:     Rate and Rhythm: Normal rate and regular rhythm.     Heart sounds: No murmur.  Pulmonary:     Effort: Pulmonary effort is normal. No respiratory distress.     Breath sounds: Normal breath sounds.  Abdominal:     Palpations: Abdomen is soft.     Tenderness: There is no abdominal tenderness.  Musculoskeletal:        General: No swelling. Normal range of motion.     Cervical back: Neck supple.     Comments: Left foot dorsalis pedis pulses 2+.  Sensation is intact to the top of the foot the bottom of the foot.  Good strength to dorsiflexion and plantarflexion of the foot.  Patient with pain with leg raise mostly in the thigh area.  Not in the back area.  It is difficult to tell whether there is true weakness in the thigh area or is just due to pain.  Distally there is no weakness or any sensory deficit.  Skin:    General: Skin is warm and dry.     Capillary Refill: Capillary refill takes less than 2 seconds.  Neurological:     Mental Status: She is alert and oriented to person, place, and time.     Cranial Nerves: No cranial nerve deficit.     Sensory: No sensory deficit.     Motor: Weakness  present.     ED Results / Procedures / Treatments   Labs (all labs ordered are listed, but only abnormal results are displayed) Labs Reviewed  URINALYSIS, ROUTINE W REFLEX MICROSCOPIC - Abnormal; Notable for the following components:      Result Value   APPearance HAZY (*)    Leukocytes,Ua TRACE (*)    Bacteria, UA MANY (*)    All other components within normal limits  BASIC METABOLIC PANEL  CBC    EKG None  Radiology MR Lumbar Spine Wo Contrast  Result Date: 11/02/2019 CLINICAL DATA:  Left lower  extremity weakness. EXAM: MRI LUMBAR SPINE WITHOUT CONTRAST TECHNIQUE: Multiplanar, multisequence MR imaging of the lumbar spine was performed. No intravenous contrast was administered. COMPARISON:  None. FINDINGS: Segmentation:  Standard. Alignment:  Physiologic. Vertebrae:  No fracture, evidence of discitis, or bone lesion. Conus medullaris and cauda equina: Conus extends to the L1 level. Conus and cauda equina appear normal. Paraspinal and other soft tissues: Negative. Disc levels: T12-L1: Normal disc space and facet joints. There is no spinal canal stenosis. No neural foraminal stenosis. L1-L2: Normal disc space and facet joints. There is no spinal canal stenosis. No neural foraminal stenosis. L2-L3: Normal disc space and facet joints. There is no spinal canal stenosis. No neural foraminal stenosis. L3-L4: Left eccentric disc bulge. Normal facets. Left lateral recess narrowing without central spinal canal stenosis. No neural foraminal stenosis. L4-L5: Mild disc bulge. There is no spinal canal stenosis. No neural foraminal stenosis. L5-S1: Small central disc protrusion annular fissure. Normal facets. there is no spinal canal stenosis. No neural foraminal stenosis. Visualized sacrum: Normal. IMPRESSION: 1. Mild lumbar degenerative disc disease without central spinal canal or neural foraminal stenosis. 2. Left lateral recess stenosis at L3-L4 may serve as a source of L4 radiculopathy. Electronically  Signed   By: Ulyses Jarred M.D.   On: 11/02/2019 21:49    Procedures Procedures (including critical care time)  Medications Ordered in ED Medications - No data to display  ED Course  I have reviewed the triage vital signs and the nursing notes.  Pertinent labs & imaging results that were available during my care of the patient were reviewed by me and considered in my medical decision making (see chart for details).    MDM Rules/Calculators/A&P                     MRI shows some abnormality at the L3-L4 area on the left side with [place] with some narrowing around L4 which could possibly explain symptoms.  No bony abnormalities otherwise.  Will give patient referral to neurosurgery.  We will also have them contact primary care doctor tomorrow to take a look at the MRI results.  She needs to cancel the outpatient MRI.  Patient will continue her medications.  We will give her a work note to be out of work for a week so that she can rest her back.  Final Clinical Impression(s) / ED Diagnoses Final diagnoses:  Lumbar radiculopathy, acute    Rx / DC Orders ED Discharge Orders    None       Fredia Sorrow, MD 11/02/19 2236

## 2019-11-03 ENCOUNTER — Telehealth: Payer: Self-pay | Admitting: Family Medicine

## 2019-11-03 ENCOUNTER — Other Ambulatory Visit: Payer: Self-pay | Admitting: Family Medicine

## 2019-11-03 DIAGNOSIS — R29898 Other symptoms and signs involving the musculoskeletal system: Secondary | ICD-10-CM

## 2019-11-03 DIAGNOSIS — G514 Facial myokymia: Secondary | ICD-10-CM

## 2019-11-03 NOTE — Telephone Encounter (Signed)
Daughter is calling and would like to speak to Dr. Posey Pronto. She took her mother to the ER last night and the did the MRI, She would like to know what the next steps should be. Please call her at [phone]. Blima Rich

## 2019-11-03 NOTE — Telephone Encounter (Signed)
Contacted pt's daughter regarding MRI spine results. She explained that the ER physician had referred her to Neurosurgery last night but it was not covered by her insurance. She called [facility] and they said they would accept her mother's insurance. I said I will refer her to that clinic. Will also refer her to Neurology for facial twitching.

## 2019-11-05 DIAGNOSIS — G44209 Tension-type headache, unspecified, not intractable: Secondary | ICD-10-CM | POA: Insufficient documentation

## 2019-11-05 DIAGNOSIS — R29898 Other symptoms and signs involving the musculoskeletal system: Secondary | ICD-10-CM | POA: Insufficient documentation

## 2019-11-05 NOTE — Progress Notes (Signed)
   Patient presents today with her daughter who translated in [place] for patient  CHIEF COMPLAINT / HPI: Headache, left leg weakness and lip twitching  Headache One episode of bilateral temporal pain which started 4 days ago.  Feels like " ants crawling in my head".  8 out of 10 in severity, nonradiating pain.  Was not constant. Took Aleve which helped and has not had a headache again.  Denies recent head trauma, vomiting or visual disturbances. Admits that she is usually dehydrated and does not drink much water at work.  Left leg weakness 2 weeks ago had onset of sciatica type pain in left leg.  A week later developed left leg weakness. Describes weakness as " heavy" and " numb".  Feels unstable on her feet and feels that she may fall and time especially when going up and down stairs.  Denies any weakness in right extremity or upper extremities.  Weakness is the same throughout the day.  Denies urinary/bowel incontinence, saddle anesthesia, change in sensation in lower extremities , slurring of speech, drooping, visual disturbances or recent trauma.  Daughter is very concerned and is asking whether she needs imaging.  PERTINENT  PMH / PSH: Sciatica, low back pain  OBJECTIVE: BP 132/70   Pulse 72   Wt 179 lb 9.6 oz (81.5 kg)   SpO2 99%   BMI 35.08 kg/m   General: Pleasant, appears to be in no acute distress Cardio: Normal S1 and S2, RRR Pulm: CTAB, normal work of breathing t Abdomen: Bowel sounds normal. Abdomen soft and non-tender.  Extremities: 5/5 strength in right LE. 3/5 strength in left LE. Normal sensation bilaterally. Unable to elicit reflexes bilaterally. Antalgic gait. Neuro: Cranial nerve examination normal  ASSESSMENT / PLAN:  Left leg weakness Considering radiculopathy as main differential. Considered cauda equina syndrome however no red flag symptoms ie saddle anaesthesia, urinary/bowel incontinence etc. Considered stroke however less likely given only left leg weakness  present, no PMH of stroke and no risk factors. -Urgent lumbar MRI -F/u at [facility] after lumbar MRI results, I will f/u on results, will consider orthopedic/neurosurgical referral based on results. If results normal then can refer to PT.  Tension headache Most likely tension headache given temporal nature, alleviated with NSAID and linked to dehydration of pt. No neurological symptoms and low suspicion for intracranial cause. No recent head trauma so low suspicion for extradural/subdural hematoma. Reassured pt and told to take tylenol/NSAIDs if tension headache persist and drink adequate amount of water. Told pt to go to ER if gets frequent headaches which are more severe, develops neurological sx of has a change in character of headache.     Lattie Haw, MD [place]

## 2019-11-05 NOTE — Assessment & Plan Note (Addendum)
Considering radiculopathy as main differential. Considered cauda equina syndrome however no red flag symptoms ie saddle anaesthesia, urinary/bowel incontinence etc. Considered stroke however less likely given only left leg weakness present, no PMH of stroke and no risk factors. -Urgent lumbar MRI -F/u at [facility] after lumbar MRI results, I will f/u on results, will consider orthopedic/neurosurgical referral based on results. If results normal then can refer to PT.

## 2019-11-05 NOTE — Assessment & Plan Note (Addendum)
Most likely tension headache given temporal nature, alleviated with NSAID and linked to dehydration of pt. No neurological symptoms and low suspicion for intracranial cause. No recent head trauma so low suspicion for extradural/subdural hematoma. Reassured pt and told to take tylenol/NSAIDs if tension headache persist and drink adequate amount of water. Told pt to go to ER if gets frequent headaches which are more severe, develops neurological sx of has a change in character of headache.

## 2019-11-27 ENCOUNTER — Ambulatory Visit: Payer: 59 | Attending: Internal Medicine

## 2019-11-27 DIAGNOSIS — Z23 Encounter for immunization: Secondary | ICD-10-CM | POA: Insufficient documentation

## 2019-11-27 NOTE — Progress Notes (Signed)
   Covid-19 Vaccination Clinic  Name:  Carrie Shelton    MRN: [id number] DOB: [birth year]  11/27/2019  [name] was observed post Covid-19 immunization for 15 minutes without incident. She was provided with Vaccine Information Sheet and instruction to access the V-Safe system.   [name] was instructed to call 911 with any severe reactions post vaccine: Marland Kitchen Difficulty breathing  . Swelling of face and throat  . A fast heartbeat  . A bad rash all over body  . Dizziness and weakness   Immunizations Administered    Name Date Dose VIS Date Route   Pfizer COVID-19 Vaccine 11/27/2019  9:54 AM 0.3 mL 09/02/2019 Intramuscular   Manufacturer: [place]   Lot: [id number]   [place]: [id number]

## 2019-12-28 ENCOUNTER — Ambulatory Visit: Payer: 59 | Attending: Internal Medicine

## 2019-12-28 DIAGNOSIS — Z23 Encounter for immunization: Secondary | ICD-10-CM

## 2019-12-28 NOTE — Progress Notes (Signed)
   Covid-19 Vaccination Clinic  Name:  Carrie Shelton    MRN: [id number] DOB: April 30, 1971  12/28/2019  [name] was observed post Covid-19 immunization for 15 minutes without incident. She was provided with Vaccine Information Sheet and instruction to access the V-Safe system.   [name] was instructed to call 911 with any severe reactions post vaccine: Marland Kitchen Difficulty breathing  . Swelling of face and throat  . A fast heartbeat  . A bad rash all over body  . Dizziness and weakness   Immunizations Administered    Name Date Dose VIS Date Route   Pfizer COVID-19 Vaccine 12/28/2019  3:17 PM 0.3 mL 09/02/2019 Intramuscular   Manufacturer: [place]   Lot: [id number]   [place]: [id number]

## 2020-01-11 ENCOUNTER — Encounter: Payer: Self-pay | Admitting: Family Medicine

## 2020-01-11 ENCOUNTER — Other Ambulatory Visit: Payer: Self-pay

## 2020-01-11 ENCOUNTER — Ambulatory Visit (INDEPENDENT_AMBULATORY_CARE_PROVIDER_SITE_OTHER): Payer: 59 | Admitting: Family Medicine

## 2020-01-11 VITALS — BP 104/68 | HR 76 | Ht 60.0 in | Wt 177.8 lb

## 2020-01-11 DIAGNOSIS — Z1211 Encounter for screening for malignant neoplasm of colon: Secondary | ICD-10-CM

## 2020-01-11 DIAGNOSIS — M722 Plantar fascial fibromatosis: Secondary | ICD-10-CM

## 2020-01-11 NOTE — Progress Notes (Signed)
    SUBJECTIVE:   Patient elected to use her daughter as Arabic interpreter  CHIEF COMPLAINT / HPI:   Left heel pain Patient reports that she has had pain just distal to the plantar surface of her left heel for several years, although it seems to be worsening recently.  She has had an ultrasound performed on this area in the past, but no other intervention has been done that she can remember.  She reports that she often has to limp because of the pain in her heel, which is often worse in the morning.  She usually wears supportive shoes, although she is wearing flip-flops today.  She also takes naproxen twice daily for her pain, which seems to be somewhat helpful..  She has no numbness, tingling, swelling, or bruising of the foot.  She denies any previous trauma to the foot.  Immigration form Patient and her daughter bring in a form that they think need to be signed by someone in our office.  They are unsure if it is the correct form or whether they should get a different form from their immigration lawyer.  PERTINENT  PMH / PSH: Inferior calcaneal bone spur, chronic low back pain, fatigue, left leg weakness  OBJECTIVE:   BP 104/68   Pulse 76   Ht 5' (1.524 m)   Wt 177 lb 12.8 oz (80.6 kg)   SpO2 100%   BMI 34.72 kg/m   General: well appearing, appears stated age Cardiac: RRR, no MRG Respiratory: CTAB, no rhonchi, rales, or wheezing, normal work of breathing Left foot: No visual abnormality, mildly tender to palpation on the distal plantar surface of the calcaneus, normal ROM on dorsiflexion, plantarflexion, abduction, and adduction, 5/5 strength in all planes, neurovascularly intact with 2+ DP and PT pulses    ASSESSMENT/PLAN:   Plantar fasciitis Per chart review, ultrasound has shown a left calcaneal heel spur, which often coincides with plantar fasciitis.  Patient's history and exam are also consistent with this diagnosis.  Patient was reassured that this condition is treatable,  although it may take several months to improve.  Gave patient handout on eccentric exercises for plantar fasciitis and encouraged her to use a frozen water bottle to massage the plantar surface of her foot.  Also counseled her that it is important to wear very supportive shoes rather than flip-flops and to avoid walking barefoot.  Counseled her and her daughter that if these interventions do not improve her pain in the next 2 months, she can pursue injection and even surgery if needed.  Colon cancer screening Counseled on the importance of colon cancer screening, and patient is interested in this.  Sent referral for colonoscopy and provided patient and her daughter with numbers of GI offices in order to make an appointment.   Dr. Owens Shark, our refugee clinic attending, was informed of patient's question and will look into the necessary immigration forms for this patient.  She will contact them regarding the next steps.  Kathrene Alu, MD [place]

## 2020-01-11 NOTE — Patient Instructions (Addendum)
Please complete each exercise for 10 times and do them 3 times per day.  It is also helpful to freeze a water bottle and roll it under the foot.  If you have not experienced improvement in two months, please let [place] know.    Our immigration doctor is looking into the forms you need and will contact you about this.  Plantar Fasciitis Rehab Ask your health care provider which exercises are safe for you. Do exercises exactly as told by your health care provider and adjust them as directed. It is normal to feel mild stretching, pulling, tightness, or discomfort as you do these exercises. Stop right away if you feel sudden pain or your pain gets worse. Do not begin these exercises until told by your health care provider. Stretching and range-of-motion exercises These exercises warm up your muscles and joints and improve the movement and flexibility of your foot. These exercises also help to relieve pain. Plantar fascia stretch  1. Sit with your left / right leg crossed over your opposite knee. 2. Hold your heel with one hand with that thumb near your arch. With your other hand, hold your toes and gently pull them back toward the top of your foot. You should feel a stretch on the bottom of your toes or your foot (plantar fascia) or both. 3. Hold this stretch for__________ seconds. 4. Slowly release your toes and return to the starting position. Repeat __________ times. Complete this exercise __________ times a day. Gastrocnemius stretch, standing This exercise is also called a calf (gastroc) stretch. It stretches the muscles in the back of the upper calf. 1. Stand with your hands against a wall. 2. Extend your left / right leg behind you, and bend your front knee slightly. 3. Keeping your heels on the floor and your back knee straight, shift your weight toward the wall. Do not arch your back. You should feel a gentle stretch in your upper left / right calf. 4. Hold this position for __________  seconds. Repeat __________ times. Complete this exercise __________ times a day. Soleus stretch, standing This exercise is also called a calf (soleus) stretch. It stretches the muscles in the back of the lower calf. 1. Stand with your hands against a wall. 2. Extend your left / right leg behind you, and bend your front knee slightly. 3. Keeping your heels on the floor, bend your back knee and shift your weight slightly over your back leg. You should feel a gentle stretch deep in your lower calf. 4. Hold this position for __________ seconds. Repeat __________ times. Complete this exercise __________ times a day. Gastroc and soleus stretch, standing step This exercise stretches the muscles in the back of the lower leg. These muscles are in the upper calf (gastrocnemius) and the lower calf (soleus). 1. Stand with the ball of your left / right foot on a step. The ball of your foot is on the walking surface, right under your toes. 2. Keep your other foot firmly on the same step. 3. Hold on to the wall or a railing for balance. 4. Slowly lift your other foot, allowing your body weight to press your left / right heel down over the edge of the step. You should feel a stretch in your left / right calf. 5. Hold this position for __________ seconds. 6. Return both feet to the step. 7. Repeat this exercise with a slight bend in your left / right knee. Repeat __________ times with your left / right  knee straight and __________ times with your left / right knee bent. Complete this exercise __________ times a day. Balance exercise This exercise builds your balance and strength control of your arch to help take pressure off your plantar fascia. Single leg stand If this exercise is too easy, you can try it with your eyes closed or while standing on a pillow. 1. Without shoes, stand near a railing or in a doorway. You may hold on to the railing or door frame as needed. 2. Stand on your left / right foot. Keep  your big toe down on the floor and try to keep your arch lifted. Do not let your foot roll inward. 3. Hold this position for __________ seconds. Repeat __________ times. Complete this exercise __________ times a day. This information is not intended to replace advice given to you by your health care provider. Make sure you discuss any questions you have with your health care provider. Document Revised: 12/30/2018 Document Reviewed: 07/07/2018 Elsevier Patient Education  [employer].  Plantar Fasciitis  Plantar fasciitis is a painful foot condition that affects the heel. It occurs when the band of tissue that connects the toes to the heel bone (plantar fascia) becomes irritated. This can happen as the result of exercising too much or doing other repetitive activities (overuse injury). The pain from plantar fasciitis can range from mild irritation to severe pain that makes it difficult to walk or move. The pain is usually worse in the morning after sleeping, or after sitting or lying down for a while. Pain may also be worse after long periods of walking or standing. What are the causes? This condition may be caused by:  Standing for long periods of time.  Wearing shoes that do not have good arch support.  Doing activities that put stress on joints (high-impact activities), including running, aerobics, and ballet.  Being overweight.  An abnormal way of walking (gait).  Tight muscles in the back of your lower leg (calf).  High arches in your feet.  Starting a new athletic activity. What are the signs or symptoms? The main symptom of this condition is heel pain. Pain may:  Be worse with first steps after a time of rest, especially in the morning after sleeping or after you have been sitting or lying down for a while.  Be worse after long periods of standing still.  Decrease after 30-45 minutes of activity, such as gentle walking. How is this diagnosed? This condition may be  diagnosed based on your medical history and your symptoms. Your health care provider may ask questions about your activity level. Your health care provider will do a physical exam to check for:  A tender area on the bottom of your foot.  A high arch in your foot.  Pain when you move your foot.  Difficulty moving your foot. You may have imaging tests to confirm the diagnosis, such as:  X-rays.  Ultrasound.  MRI. How is this treated? Treatment for plantar fasciitis depends on how severe your condition is. Treatment may include:  Rest, ice, applying pressure (compression), and raising the affected foot (elevation). This may be called RICE therapy. Your health care provider may recommend RICE therapy along with over-the-counter pain medicines to manage your pain.  Exercises to stretch your calves and your plantar fascia.  A splint that holds your foot in a stretched, upward position while you sleep (night splint).  Physical therapy to relieve symptoms and prevent problems in the future.  Injections of steroid medicine (cortisone) to relieve pain and inflammation.  Stimulating your plantar fascia with electrical impulses (extracorporeal shock wave therapy). This is usually the last treatment option before surgery.  Surgery, if other treatments have not worked after 12 months. Follow these instructions at home:  Managing pain, stiffness, and swelling  If directed, put ice on the painful area: ? Put ice in a plastic bag, or use a frozen bottle of water. ? Place a towel between your skin and the bag or bottle. ? Roll the bottom of your foot over the bag or bottle. ? Do this for 20 minutes, 2-3 times a day.  Wear athletic shoes that have air-sole or gel-sole cushions, or try wearing soft shoe inserts that are designed for plantar fasciitis.  Raise (elevate) your foot above the level of your heart while you are sitting or lying down. Activity  Avoid activities that cause pain.  Ask your health care provider what activities are safe for you.  Do physical therapy exercises and stretches as told by your health care provider.  Try activities and forms of exercise that are easier on your joints (low-impact). Examples include swimming, water aerobics, and biking. General instructions  Take over-the-counter and prescription medicines only as told by your health care provider.  Wear a night splint while sleeping, if told by your health care provider. Loosen the splint if your toes tingle, become numb, or turn cold and blue.  Maintain a healthy weight, or work with your health care provider to lose weight as needed.  Keep all follow-up visits as told by your health care provider. This is important. Contact a health care provider if you:  Have symptoms that do not go away after caring for yourself at home.  Have pain that gets worse.  Have pain that affects your ability to move or do your daily activities. Summary  Plantar fasciitis is a painful foot condition that affects the heel. It occurs when the band of tissue that connects the toes to the heel bone (plantar fascia) becomes irritated.  The main symptom of this condition is heel pain that may be worse after exercising too much or standing still for a long time.  Treatment varies, but it usually starts with rest, ice, compression, and elevation (RICE therapy) and over-the-counter medicines to manage pain. This information is not intended to replace advice given to you by your health care provider. Make sure you discuss any questions you have with your health care provider. Document Revised: 08/21/2017 Document Reviewed: 07/06/2017 Elsevier Patient Education  2020 [employer].

## 2020-01-12 ENCOUNTER — Telehealth: Payer: Self-pay | Admitting: Family Medicine

## 2020-01-12 DIAGNOSIS — M722 Plantar fascial fibromatosis: Secondary | ICD-10-CM | POA: Insufficient documentation

## 2020-01-12 DIAGNOSIS — Z1211 Encounter for screening for malignant neoplasm of colon: Secondary | ICD-10-CM | POA: Insufficient documentation

## 2020-01-12 NOTE — Assessment & Plan Note (Signed)
Per chart review, ultrasound has shown a left calcaneal heel spur, which often coincides with plantar fasciitis.  Patient's history and exam are also consistent with this diagnosis.  Patient was reassured that this condition is treatable, although it may take several months to improve.  Gave patient handout on eccentric exercises for plantar fasciitis and encouraged her to use a frozen water bottle to massage the plantar surface of her foot.  Also counseled her that it is important to wear very supportive shoes rather than flip-flops and to avoid walking barefoot.  Counseled her and her daughter that if these interventions do not improve her pain in the next 2 months, she can pursue injection and even surgery if needed.

## 2020-01-12 NOTE — Telephone Encounter (Signed)
Received [phone] request.  Chart and documentation reviewed.  Recommend patient go through [facility] (they have process to establish criteria) to determine if she would qualify. Inability to learn English (alone) is not sufficient to qualify for exemption. I suspect this is the case here and N-[id number] completion would not be useful for her.   Please call patient with above recommendations.I am happy to discuss further.   Dorris Singh, MD  Family Medicine Teaching Service

## 2020-01-12 NOTE — Assessment & Plan Note (Signed)
Counseled on the importance of colon cancer screening, and patient is interested in this.  Sent referral for colonoscopy and provided patient and her daughter with numbers of GI offices in order to make an appointment.

## 2020-01-20 NOTE — Telephone Encounter (Signed)
Contacted pt and daughter answered the phone and I informed her of below.  I gave her the contact information that was provided by Dr. Owens Shark which is listed below and she will reach out to them to see about getting this process taken care of. [place], [place] [facility] [address], [place] [address] Phone: [phone]

## 2020-02-09 ENCOUNTER — Ambulatory Visit: Payer: 59 | Admitting: Family Medicine

## 2020-02-16 ENCOUNTER — Encounter: Payer: Self-pay | Admitting: Family Medicine

## 2020-03-29 ENCOUNTER — Ambulatory Visit: Payer: 59 | Admitting: Family Medicine

## 2020-03-30 ENCOUNTER — Encounter: Payer: Self-pay | Admitting: Family Medicine

## 2020-03-30 ENCOUNTER — Other Ambulatory Visit: Payer: Self-pay

## 2020-03-30 ENCOUNTER — Ambulatory Visit (INDEPENDENT_AMBULATORY_CARE_PROVIDER_SITE_OTHER): Payer: 59 | Admitting: Family Medicine

## 2020-03-30 VITALS — BP 110/72 | HR 94 | Ht 60.0 in | Wt 181.2 lb

## 2020-03-30 DIAGNOSIS — K625 Hemorrhage of anus and rectum: Secondary | ICD-10-CM

## 2020-03-30 LAB — POCT HEMOGLOBIN: Hemoglobin: 12.7 g/dL (ref 11–14.6)

## 2020-03-30 MED ORDER — HYDROCORTISONE (PERIANAL) 2.5 % EX CREA: 1.0000 "application " | TOPICAL_CREAM | Freq: Two times a day (BID) | CUTANEOUS | 0 refills | Status: AC

## 2020-03-30 NOTE — Patient Instructions (Addendum)
Thank you for coming to see me today. It was a pleasure. Today we talked about:   Referral to GI for colonoscopy I ordered some cream for rectal itching  Please follow-up with PCP in 2 weeks   If you have any questions or concerns, please do not hesitate to call the office at [phone].  Best,   Carollee Leitz, MD Family Medicine Residency

## 2020-03-31 LAB — CBC WITH DIFFERENTIAL/PLATELET
Basophils Absolute: 0 10*3/uL (ref 0.0–0.2)
Basos: 0 %
EOS (ABSOLUTE): 0.1 10*3/uL (ref 0.0–0.4)
Eos: 1 %
Hematocrit: 37.6 % (ref 34.0–46.6)
Hemoglobin: 12.1 g/dL (ref 11.1–15.9)
Immature Grans (Abs): 0 10*3/uL (ref 0.0–0.1)
Immature Granulocytes: 0 %
Lymphocytes Absolute: 2.1 10*3/uL (ref 0.7–3.1)
Lymphs: 36 %
MCH: 26.5 pg — ABNORMAL LOW (ref 26.6–33.0)
MCHC: 32.2 g/dL (ref 31.5–35.7)
MCV: 83 fL (ref 79–97)
Monocytes Absolute: 0.7 10*3/uL (ref 0.1–0.9)
Monocytes: 12 %
Neutrophils Absolute: 3 10*3/uL (ref 1.4–7.0)
Neutrophils: 51 %
Platelets: 306 10*3/uL (ref 150–450)
RBC: 4.56 x10E6/uL (ref 3.77–5.28)
RDW: 14.5 % (ref 11.7–15.4)
WBC: 6 10*3/uL (ref 3.4–10.8)

## 2020-04-03 ENCOUNTER — Encounter: Payer: Self-pay | Admitting: Family Medicine

## 2020-04-03 DIAGNOSIS — K625 Hemorrhage of anus and rectum: Secondary | ICD-10-CM | POA: Insufficient documentation

## 2020-04-03 NOTE — Assessment & Plan Note (Signed)
Rectal bleeding likely secondary to internal hemorrhoids given that patient also reports rectal itching and external hemorrhoids noted on exam.  Considering differentials GI bleed and malignancy although patient reports no unintentional weight loss. -Hemoglobin 12.7 -CBC with differential today -Refer to GI for colonoscopy -Anusol ointment twice daily.  Given that patient was unable to tolerate anoscope will hold off on suppositories -Follow-up with PCP in 2 weeks.

## 2020-04-03 NOTE — Progress Notes (Signed)
    SUBJECTIVE:   CHIEF COMPLAINT / HPI: Fatigue, rectal bleeding and rectal itching.  Patient reports she was overseas traveling to [place] returned to the [place] on June 24.  Reports having had Covid vaccines and no Covid symptoms.  She reports that she started having rectal bleeding and itching about 8 days ago.  She noticed that she has bright red blood on toilet paper after each bowel movement.  Denies any pain or blood in stool.  Reports having regular bowel movements but does have history of constipation.  Denies any weight loss.  Reports NSAID use with Aleve and ibuprofen for which she takes only on as needed basis.  She was referred to GI for colonoscopy prior to her leaving today but had not been able to make that appointment.     PERTINENT  PMH / PSH:  Postmenopausal.  LMP 49 years old Constipation  OBJECTIVE:   BP 110/72   Pulse 94   Ht 5' (1.524 m)   Wt 181 lb 3.2 oz (82.2 kg)   SpO2 99%   BMI 35.39 kg/m    General: Alert and oriented, no apparent distress   Gastrointestinal: Bowel sounds present. No abdominal pain. Rectum: External hemorrhoids noted, no obvious bleeding noted and patient unable to tolerate anoscope   ASSESSMENT/PLAN:   Rectal bleeding Rectal bleeding likely secondary to internal hemorrhoids given that patient also reports rectal itching and external hemorrhoids noted on exam.  Considering differentials GI bleed and malignancy although patient reports no unintentional weight loss. -Hemoglobin 12.7 -CBC with differential today -Refer to GI for colonoscopy -Anusol ointment twice daily.  Given that patient was unable to tolerate anoscope will hold off on suppositories -Follow-up with PCP in 2 weeks.     Carollee Leitz, MD [place]

## 2020-10-03 ENCOUNTER — Other Ambulatory Visit: Payer: Self-pay | Admitting: Family Medicine

## 2020-10-03 DIAGNOSIS — R1031 Right lower quadrant pain: Secondary | ICD-10-CM

## 2020-10-13 IMAGING — MR MR LUMBAR SPINE W/O CM
2 series · 19 of 48 positions shown · non-contrast
Comparison: None.

CLINICAL DATA: Left lower extremity weakness.

EXAM:
MRI LUMBAR SPINE WITHOUT CONTRAST
TECHNIQUE: Multiplanar, multisequence MR imaging of the lumbar spine was
performed. No intravenous contrast was administered.

[Series 3: T2 · sagittal · 4.0mm · 0.43mm/px · 9 of 14 slices shown (1 of 2)]
[im 1/14]
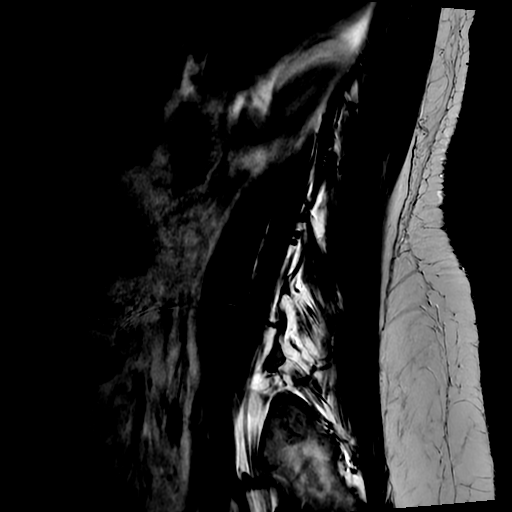
[im 3/14]
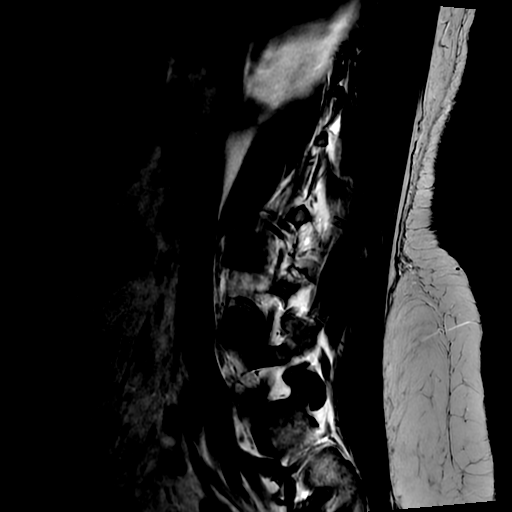
[im 5/14]
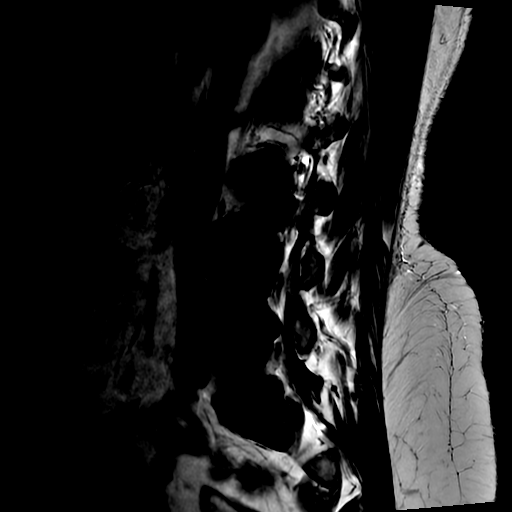
[im 6/14]
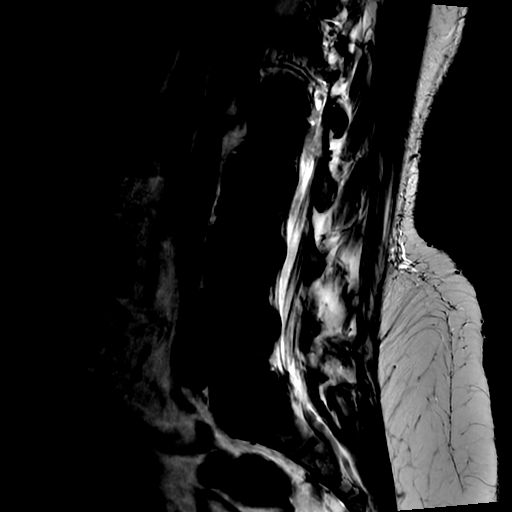
[im 7/14]
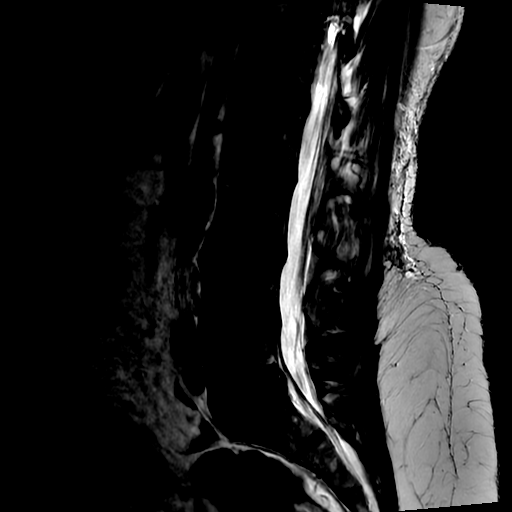
[im 8/14]
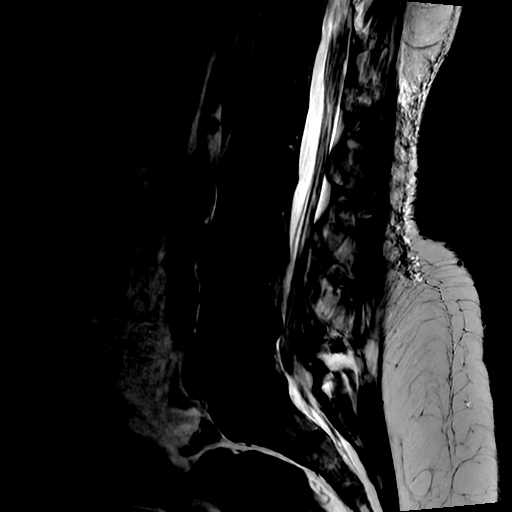
[im 9/14]
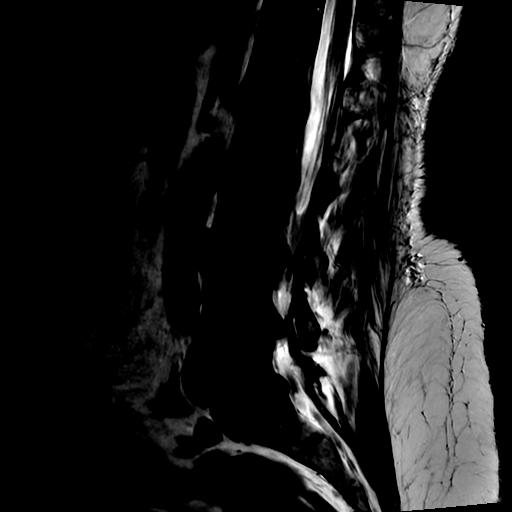
[im 11/14]
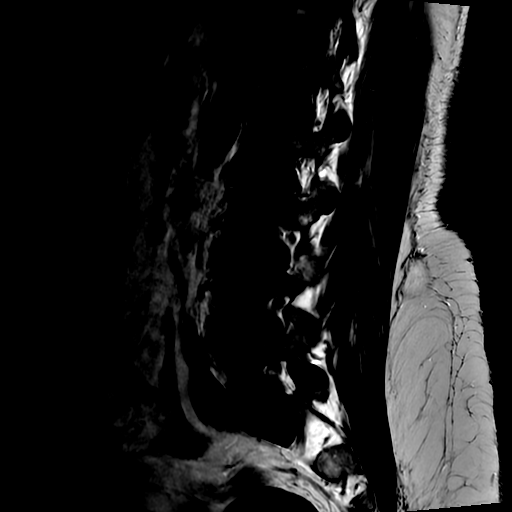
[im 14/14]
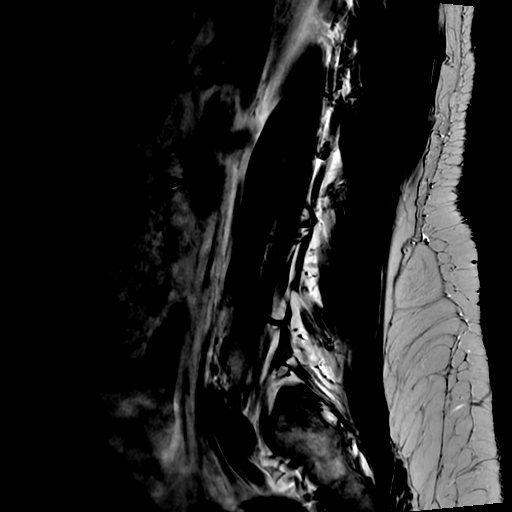

[Series 6: T2 · axial · 4.0mm · 0.39mm/px · z∈[+27,+186]mm · 10 of 38 slices shown (2 of 2)]
[im 3/38]
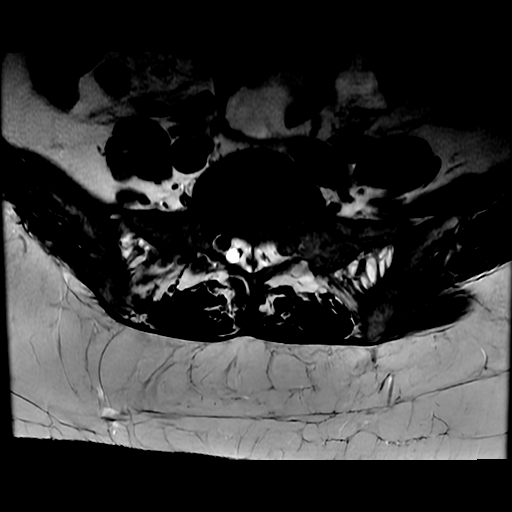
[im 6/38]
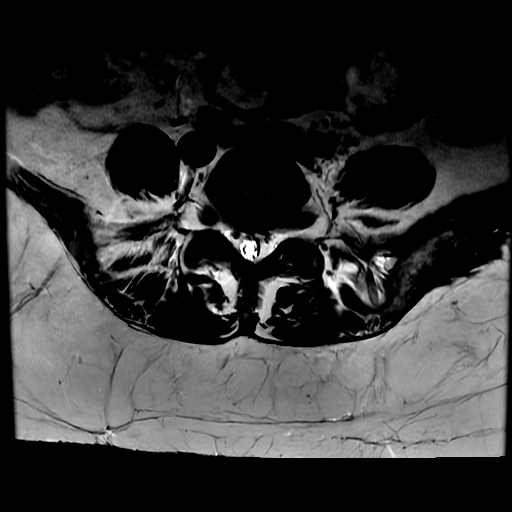
[im 7/38]
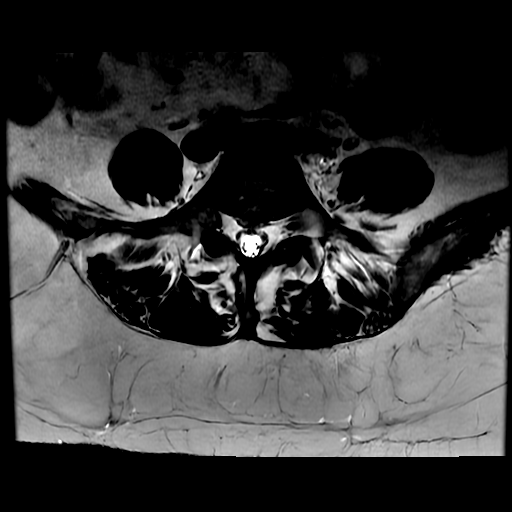
[im 11/38]
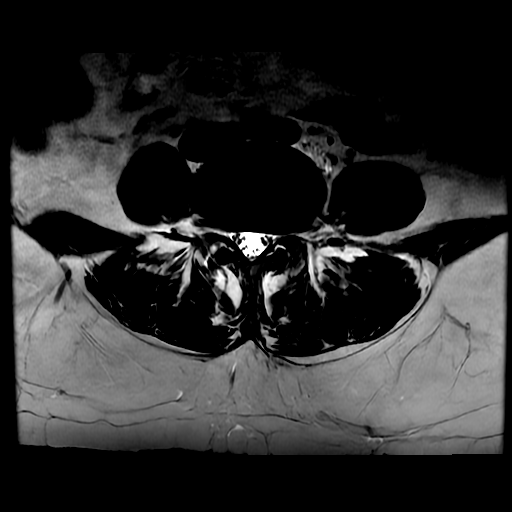
[im 17/38]
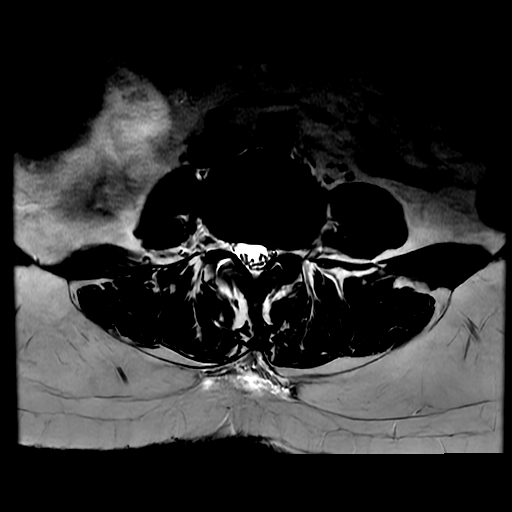
[im 19/38]
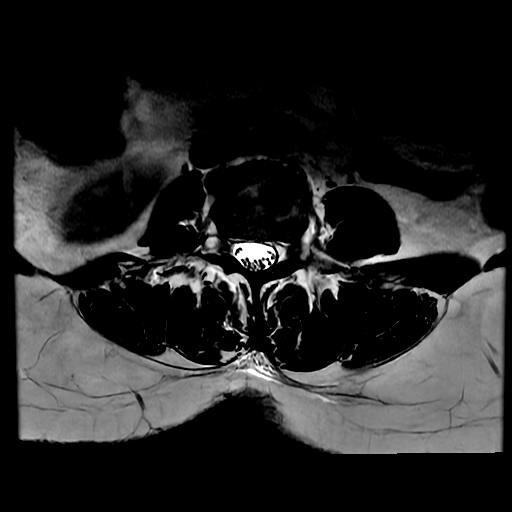
[im 21/38]
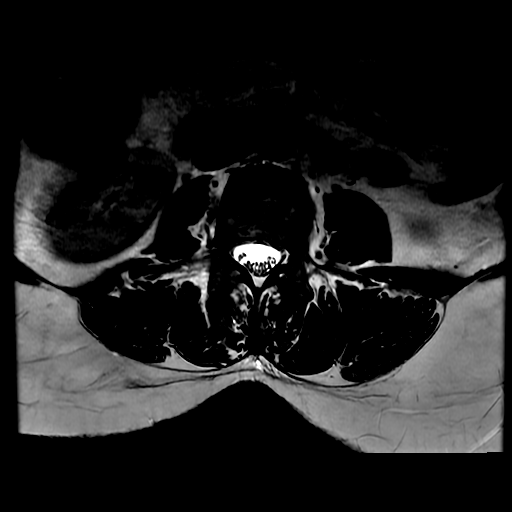
[im 27/38]
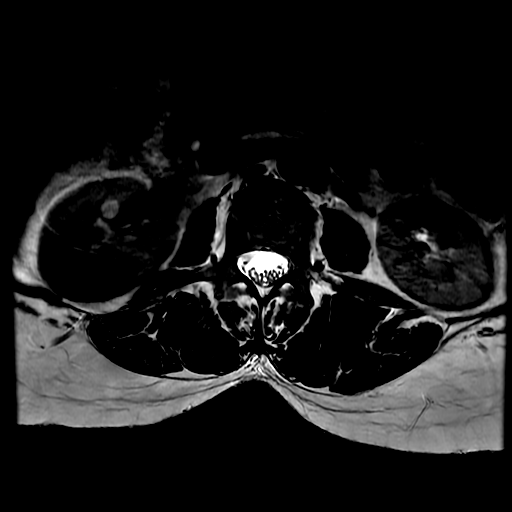
[im 31/38]
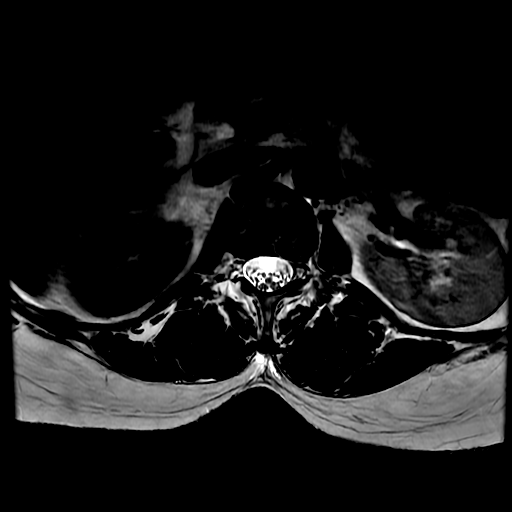
[im 32/38]
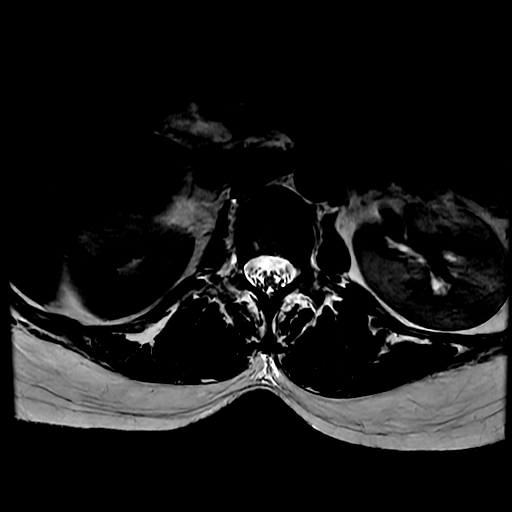

[19 of 48 positions shown; findings below may reference images not displayed]

FINDINGS: Segmentation:  Standard.

Alignment:  Physiologic.

Vertebrae:  No fracture, evidence of discitis, or bone lesion.

Conus medullaris and cauda equina: Conus extends to the L1 level.
Conus and cauda equina appear normal.

Paraspinal and other soft tissues: Negative.

Disc levels:

T12-L1: Normal disc space and facet joints. There is no spinal canal
stenosis. No neural foraminal stenosis.

L1-L2: Normal disc space and facet joints. There is no spinal canal
stenosis. No neural foraminal stenosis.

L2-L3: Normal disc space and facet joints. There is no spinal canal
stenosis. No neural foraminal stenosis.

L3-L4: Left eccentric disc bulge. Normal facets. Left lateral recess
narrowing without central spinal canal stenosis. No neural foraminal
stenosis.

L4-L5: Mild disc bulge. There is no spinal canal stenosis. No neural
foraminal stenosis.

L5-S1: Small central disc protrusion annular fissure. Normal facets.
there is no spinal canal stenosis. No neural foraminal stenosis.

Visualized sacrum: Normal.
IMPRESSION: 1. Mild lumbar degenerative disc disease without central spinal
canal or neural foraminal stenosis.
2. Left lateral recess stenosis at L3-L[DATE] serve as a source of L4
radiculopathy.

## 2020-10-19 ENCOUNTER — Other Ambulatory Visit: Payer: Self-pay

## 2020-10-19 ENCOUNTER — Ambulatory Visit
Admission: RE | Admit: 2020-10-19 | Discharge: 2020-10-19 | Disposition: A | Discharge status: Home / Self Care | Payer: 59 | Source: Ambulatory Visit | Attending: Family Medicine | Admitting: Family Medicine

## 2020-10-19 DIAGNOSIS — R1031 Right lower quadrant pain: Secondary | ICD-10-CM

## 2020-10-19 MED ORDER — IOPAMIDOL (ISOVUE-300) INJECTION 61%
100.0000 mL | Freq: Once | INTRAVENOUS | Status: AC | PRN
  Administered 2020-10-19: 100 mL via INTRAVENOUS

## 2020-11-08 ENCOUNTER — Ambulatory Visit: Payer: 59 | Admitting: Family Medicine

## 2020-11-08 ENCOUNTER — Other Ambulatory Visit: Payer: Self-pay

## 2020-11-08 ENCOUNTER — Ambulatory Visit (INDEPENDENT_AMBULATORY_CARE_PROVIDER_SITE_OTHER): Payer: 59

## 2020-11-08 VITALS — BP 108/72 | HR 88 | Ht 60.0 in | Wt 187.4 lb

## 2020-11-08 DIAGNOSIS — M545 Low back pain, unspecified: Secondary | ICD-10-CM | POA: Diagnosis not present

## 2020-11-08 DIAGNOSIS — Z23 Encounter for immunization: Secondary | ICD-10-CM

## 2020-11-08 NOTE — Progress Notes (Signed)
SUBJECTIVE:   CHIEF COMPLAINT / HPI:   LBP and Right Sided Leg Pain Happens at the same time Has been on and off for years, this is the same Have gone to UC or ED and recevied steroids previously History of known left lower extremity weakness and paresthesias She was seen by neurosurgery on 2/22 and 12/26/2019 At that time he reported that she would need lifelong physical therapy and she was to follow-up with them in 6 to 8 weeks after physical therapy for reevaluation She had an MRI of her lumbar spine on 11/02/2019, left lateral recess stenosis at L3-L4 Per their notes, she also had C and T spine MRI that were unremarkable, but not in PACS Left leg feels well now, now only the pain is on the right This feels the same as how the left used to feel Started in November briefly, then it has gotten worse over the last   She is currently taking steroids prescribed at [facility] 2 weeks ago Went to their walk in clinic Was also prescribed a muscle relaxer She also got a steroid shot She was not referred to PT She came today because she wanted to have this condition explained and see what to do long term No saddle anesthesia, changes in bowel/bladder habits   Daughter is present who functions as interpretor at patient request   PERTINENT  PMH / PSH: History of chronic low back pain, sciatica, left lower extremity weakness  OBJECTIVE:   BP 108/72   Pulse 88   Ht 5' (1.524 m)   Wt 187 lb 6.4 oz (85 kg)   SpO2 100%   BMI 36.60 kg/m    Physical Exam:  General: 50 y.o. female in NAD Lungs: Breathing comfortably on room air Skin: warm and dry  Lumbar spine:  - Inspection: no gross deformity or asymmetry, swelling or ecchymosis - Palpation: No TTP over the spinous processes, has mild TTP over right paraspinal muscles and piriformis on right specifically, No TTP  SI joints b/l, no TTP along IT band - ROM: full active ROM of the lumbar spine in flexion and extension without pain -  Strength: 5/5 strength of lower extremity in L4-S1 nerve root distributions b/l; normal gait - Neuro: sensation intact in the L4-S1 nerve root distribution b/l, 2+ L4 and S1 reflexes - Special testing: Negative straight leg raise, negative Stork test    ASSESSMENT/PLAN:   Low back pain with radiation Discussed with patient and her daughter that she has had some degenerative changes on her previous MRIs. No red flag symptoms to suggest nerve compression or cauda equina.  Given chronicity, discussed that PT should be continued long term and that realistically, patient will never be completely pain free, but that pain should be tolerable.  She should continue PT long term and perform exercises daily at home.  She has a very strenuous job that requires heavy lifting, therefore in the acute period for the next 2 weeks, we will not have her lift anything over 10 pounds.  She will likely need formal evaluation of her ability to lift at work, therefore will refer to occupational therapy as well.  She is previously established with an orthopedic doctor, therefore they are welcome to go back there if they feel that physical therapy is not helping.  She can complete the course of steroids that she was prescribed by [facility] and also use Tylenol as needed for pain.  Recommended daily stretching at least.  Advised follow-up with  PCP in 1 month.     Cleophas Dunker, [place]

## 2020-11-08 NOTE — Assessment & Plan Note (Addendum)
Discussed with patient and her daughter that she has had some degenerative changes on her previous MRIs. No red flag symptoms to suggest nerve compression or cauda equina.  Given chronicity, discussed that PT should be continued long term and that realistically, patient will never be completely pain free, but that pain should be tolerable.  She should continue PT long term and perform exercises daily at home.  She has a very strenuous job that requires heavy lifting, therefore in the acute period for the next 2 weeks, we will not have her lift anything over 10 pounds.  She will likely need formal evaluation of her ability to lift at work, therefore will refer to occupational therapy as well.  She is previously established with an orthopedic doctor, therefore they are welcome to go back there if they feel that physical therapy is not helping.  She can complete the course of steroids that she was prescribed by [facility] and also use Tylenol as needed for pain.  Recommended daily stretching at least.  Advised follow-up with PCP in 1 month.

## 2020-11-08 NOTE — Patient Instructions (Signed)
Thank you for coming to see me today. It was a pleasure. Today we talked about:   I have placed a referral to Occupation and Physical Therapy for your back pain.  If you do not hear from them in the next 2 weeks, please give [place] a call.  You can take tylenol as needed for pain.  You can consider an appointment with her Orthopedic doctor if she would like, or she can try therapy first.  Please follow-up with PCP in 1 month.  If you have any questions or concerns, please do not hesitate to call the office at [phone].  Best,   [place] Constable, DO

## 2020-11-27 ENCOUNTER — Ambulatory Visit: Payer: 59 | Admitting: Occupational Therapy

## 2020-11-27 ENCOUNTER — Other Ambulatory Visit: Payer: Self-pay

## 2020-11-27 ENCOUNTER — Encounter: Payer: Self-pay | Admitting: Physical Therapy

## 2020-11-27 ENCOUNTER — Ambulatory Visit: Payer: 59 | Attending: Family Medicine | Admitting: Physical Therapy

## 2020-11-27 DIAGNOSIS — G8929 Other chronic pain: Secondary | ICD-10-CM | POA: Insufficient documentation

## 2020-11-27 DIAGNOSIS — M6281 Muscle weakness (generalized): Secondary | ICD-10-CM | POA: Insufficient documentation

## 2020-11-27 DIAGNOSIS — M5441 Lumbago with sciatica, right side: Secondary | ICD-10-CM | POA: Insufficient documentation

## 2020-11-27 DIAGNOSIS — R252 Cramp and spasm: Secondary | ICD-10-CM | POA: Insufficient documentation

## 2020-11-27 NOTE — Therapy (Signed)
[place]. [place], [place], [address] Phone: [phone]   Fax:  [phone]  Physical Therapy Evaluation  Patient Details  Name: Carrie Shelton MRN: [id number] Date of Birth: 26-Mar-1971 Referring Provider (PT): Lavell Islam Date: 11/27/2020   PT End of Session - 11/27/20 1746    Visit Number 1    Date for PT Re-Evaluation 01/27/21    PT Start Time 1540    PT Stop Time 0867    PT Time Calculation (min) 36 min    Activity Tolerance Patient tolerated treatment well    Behavior During Therapy [facility] for tasks assessed/performed           Past Medical History:  Diagnosis Date  . Anemia   . Vertigo     Past Surgical History:  Procedure Laterality Date  . CHOLECYSTECTOMY N/A 08/01/2014   Procedure: LAPAROSCOPIC CHOLECYSTECTOMY WITH INTRAOPERATIVE CHOLANGIOGRAM;  Surgeon: Erroll Luna, MD;  Location: [place];  Service: General;  Laterality: N/A;  . DILATION AND CURETTAGE OF UTERUS     daughter is not really sure this is the only word she could remember    There were no vitals filed for this visit.    Subjective Assessment - 11/27/20 1541    Subjective Pt reports LBP with RLE radiating pain present for years and worsening over the past four months. States that pain is worse with prolonged standing. Pt reports that she has to lift weight at work up to 25 lbs. Pt states that pain radiates from LB all the way to posterior calf. Pt reports that she needs a formal work assessment because she is not able to lift as much as required and takes additional time to complete tasks.    Limitations Lifting;Standing;Walking    How long can you stand comfortably? ~4 hours; signficant pain after    Diagnostic tests MRI lumbar spine 2021    Patient Stated Goals reduce pain, be able to work    Currently in Pain? No/denies    Pain Score 0-No pain    Pain Location Back    Pain Orientation Right    Pain Descriptors /  Indicators Aching;Sharp;Tightness    Pain Type Chronic pain    Pain Radiating Towards posterior RLE    Pain Onset More than a month ago    Pain Frequency Intermittent    Aggravating Factors  prolonged standing, walking, lifting    Pain Relieving Factors rest, aleve              [facility] PT Assessment - 11/27/20 0001      Assessment   Medical Diagnosis LBP with RLE radiating pain    Referring Provider (PT) Posey Pronto    Prior Therapy none      Precautions   Precautions None      Restrictions   Weight Bearing Restrictions No      Balance Screen   Has the patient fallen in the past 6 months No    Has the patient had a decrease in activity level because of a fear of falling?  No    Is the patient reluctant to leave their home because of a fear of falling?  No      Home Environment   Additional Comments pt reports some trouble with stairs      Prior Function   Level of Independence Independent    Vocation Full time employment    Vocation Requirements works packing; requires standing, lifting,  walking    Leisure going to gym      Sensation   Light Touch Appears Intact                      Objective measurements completed on examination: See above findings.       [facility] Adult PT Treatment/Exercise - 11/27/20 0001      Exercises   Exercises Lumbar      Lumbar Exercises: Stretches   Active Hamstring Stretch Right;1 rep;20 seconds    Lower Trunk Rotation 5 reps;10 seconds    Hip Flexor Stretch Right;1 rep;30 seconds      Lumbar Exercises: Seated   Sit to Stand 10 reps                  PT Education - 11/27/20 1746    Education Details Pt educated on POC and HEP    Person(s) Educated Patient    Methods Explanation;Demonstration;Handout    Comprehension Verbalized understanding;Returned demonstration            PT Short Term Goals - 11/27/20 1747      PT SHORT TERM GOAL #1   Title Pt will be I with initial HEP    Time 2    Period Weeks     Status New    Target Date 12/11/20             PT Long Term Goals - 11/27/20 1748      PT LONG TERM GOAL #1   Title Pt will be I with advanced HEP    Time 8    Period Weeks    Status New    Target Date 01/22/21      PT LONG TERM GOAL #2   Title Pt will report resolution of RLE radiating pain    Time 6    Period Weeks    Status New    Target Date 01/09/21      PT LONG TERM GOAL #3   Title Pt will demo R SLR equivalent to LLE    Time 6    Period Weeks    Status New    Target Date 01/09/21      PT LONG TERM GOAL #4   Title Pt will report 50% reduction in LBP    Time 6    Period Weeks    Status New    Target Date 01/09/21                  Plan - 11/27/20 1747    Clinical Impression Statement Pt is 50 yo female presenting to clinic today with daughter as primary interpreter. Pt presents to clinic with reports of chronic LBP with radiating RLE pain posteriorly down to calf. Pain present for years but worsening over the past few months. Pt demos limited lumbar AROM, tenderness to palpation B lumbar paraspinals R>L, deficits in RLE flexibility, and core instability. Prior MRI shows multilevel degeneration of lumbar spine. Pt job requires lifting up to 25 lbs, prolonged standing, and repetitive movements. Pt has OT referral for formal job eval; directed family to [facility] for scheduling of this with pt and daughter VU and agreement. Pt also reporting signficant R hip flexor pain with SLR this rx. Upon palpation, pt is very tender at R hip flexor insertion, has pain with active hip flexion, and reports pain relieved with hip flexor stretching. Pt reports relief of LBP with LE stretching and hip PROM. Pt would  benefit from skilled PT to address LBP, increase core stability/lifting capacity, and address pain in R hip flexor.    Examination-Participation Restrictions Occupation;Community Activity;Interpersonal Relationship    Stability/Clinical Decision Making Evolving/Moderate  complexity    Clinical Decision Making Low    Rehab Potential Good    PT Frequency 2x / week    PT Duration 6 weeks    PT Treatment/Interventions ADLs/Self Care Home Management;Electrical Stimulation;Iontophoresis 4mg /ml Dexamethasone;Moist Heat;Functional mobility training;Therapeutic activities;Therapeutic exercise;Neuromuscular re-education;Patient/family education;Manual techniques;Passive range of motion    PT Next Visit Plan further assess lifting capacity to write informal note for job if required, follow up on pt referral for formal assessment of functional work capacity, initiate lumbar AROM/flexibility/core stab, manual to hip flexor    PT Home Exercise Plan LTR, hamstring stretch, hip flexor stretch, STS    Consulted and Agree with Plan of Care Patient           Patient will benefit from skilled therapeutic intervention in order to improve the following deficits and impairments:     Visit Diagnosis: Chronic right-sided low back pain with right-sided sciatica  Muscle weakness (generalized)  Cramp and spasm     Problem List Patient Active Problem List   Diagnosis Date Noted  . Rectal bleeding 04/03/2020  . Plantar fasciitis 01/12/2020  . Colon cancer screening 01/12/2020  . Left leg weakness 11/05/2019  . Tension headache 11/05/2019  . Sciatica 10/19/2019  . Inferior calcaneal bone spur 10/03/2019  . Fatigue 05/19/2018  . Vision changes 01/22/2018  . Seasonal allergies 01/22/2018  . Low back pain with radiation 10/08/2017  . Lipoma 09/05/2013   Amador Cunas, PT, DPT Donald Prose Pate Aylward 11/27/2020, 5:50 PM  [place] [place]. [place], [place], [address] Phone: [phone]   Fax:  [phone]  Name: Carrie Shelton MRN: [id number] Date of Birth: Dec 03, 1970

## 2020-11-29 ENCOUNTER — Other Ambulatory Visit: Payer: Self-pay

## 2020-11-29 ENCOUNTER — Ambulatory Visit: Payer: 59 | Admitting: Physical Therapy

## 2020-11-29 DIAGNOSIS — M5441 Lumbago with sciatica, right side: Secondary | ICD-10-CM | POA: Diagnosis not present

## 2020-11-29 DIAGNOSIS — M6281 Muscle weakness (generalized): Secondary | ICD-10-CM

## 2020-11-29 DIAGNOSIS — G8929 Other chronic pain: Secondary | ICD-10-CM

## 2020-11-29 NOTE — Therapy (Signed)
[place]. [place], [place], [address] Phone: [phone]   Fax:  [phone]  Physical Therapy Treatment  Patient Details  Name: Carrie Shelton MRN: [id number] Date of Birth: 11-Mar-1971 Referring Provider (PT): Lavell Islam Date: 11/29/2020   PT End of Session - 11/29/20 1613    Visit Number 2    Date for PT Re-Evaluation 01/27/21    PT Start Time 4818    PT Stop Time 5631    PT Time Calculation (min) 40 min           Past Medical History:  Diagnosis Date  . Anemia   . Vertigo     Past Surgical History:  Procedure Laterality Date  . CHOLECYSTECTOMY N/A 08/01/2014   Procedure: LAPAROSCOPIC CHOLECYSTECTOMY WITH INTRAOPERATIVE CHOLANGIOGRAM;  Surgeon: Erroll Luna, MD;  Location: [place];  Service: General;  Laterality: N/A;  . DILATION AND CURETTAGE OF UTERUS     daughter is not really sure this is the only word she could remember    There were no vitals filed for this visit.   Subjective Assessment - 11/29/20 1539    Subjective 5 min late. mornings are worse 8/10. pt verb doing HEP    Patient is accompained by: Family member    Currently in Pain? No/denies                             [facility] Adult PT Treatment/Exercise - 11/29/20 0001      Lumbar Exercises: Standing   Other Standing Lumbar Exercises hip ext and abd with HHA 10 BIL      Lumbar Exercises: Seated   Other Seated Lumbar Exercises pelvic ROM on dyna disc 10 x 4 way   red tband row and shld ext 12 x on dyna disc     Lumbar Exercises: Supine   Ab Set 10 reps;3 seconds    Pelvic Tilt 10 reps   3 sec   Clam 10 reps   green tband   Bent Knee Raise 10 reps;3 seconds   green tband   Bridge Non-compliant;10 reps;3 seconds    Bridge Limitations limited ROM d/t pain    Other Supine Lumbar Exercises obl and KTC with feet on ball 10 x      Manual Therapy   Manual Therapy Manual Traction    Manual therapy  comments pt feels relief and daughter can perform    Manual Traction sheet and leg pull- educ pt and daighter for home                    PT Short Term Goals - 11/27/20 1747      PT SHORT TERM GOAL #1   Title Pt will be I with initial HEP    Time 2    Period Weeks    Status New    Target Date 12/11/20             PT Long Term Goals - 11/27/20 1748      PT LONG TERM GOAL #1   Title Pt will be I with advanced HEP    Time 8    Period Weeks    Status New    Target Date 01/22/21      PT LONG TERM GOAL #2   Title Pt will report resolution of RLE radiating pain    Time 6    Period Weeks  Status New    Target Date 01/09/21      PT LONG TERM GOAL #3   Title Pt will demo R SLR equivalent to LLE    Time 6    Period Weeks    Status New    Target Date 01/09/21      PT LONG TERM GOAL #4   Title Pt will report 50% reduction in LBP    Time 6    Period Weeks    Status New    Target Date 01/09/21                 Plan - 11/29/20 1614    Clinical Impression Statement daughter present to translate max verb and tactile cuing needed with all ex. pt verb no pain until supine to and from sit and rolling. pt responeded well to leg pull and sheet traction s educ daughtetr for home and pt on self chair traction. d/t amount of VCIng needed will need further educ for an HEP    PT Treatment/Interventions ADLs/Self Care Home Management;Electrical Stimulation;Iontophoresis 4mg /ml Dexamethasone;Moist Heat;Functional mobility training;Therapeutic activities;Therapeutic exercise;Neuromuscular re-education;Patient/family education;Manual techniques;Passive range of motion    PT Next Visit Plan further assess lifting capacity to write informal note for job if required, follow up on pt referral for formal assessment of functional work capacity, initiate lumbar AROM/flexibility/core stab, manual to hip flexor. ISSUE HEP after further instruction           Patient will benefit  from skilled therapeutic intervention in order to improve the following deficits and impairments:  Abnormal gait,Decreased range of motion,Difficulty walking,Increased muscle spasms,Decreased activity tolerance,Pain,Hypomobility,Decreased strength  Visit Diagnosis: Muscle weakness (generalized)  Chronic right-sided low back pain with right-sided sciatica     Problem List Patient Active Problem List   Diagnosis Date Noted  . Rectal bleeding 04/03/2020  . Plantar fasciitis 01/12/2020  . Colon cancer screening 01/12/2020  . Left leg weakness 11/05/2019  . Tension headache 11/05/2019  . Sciatica 10/19/2019  . Inferior calcaneal bone spur 10/03/2019  . Fatigue 05/19/2018  . Vision changes 01/22/2018  . Seasonal allergies 01/22/2018  . Low back pain with radiation 10/08/2017  . Lipoma 09/05/2013    Roye Gustafson,ANGIE PTA 11/29/2020, 4:19 PM  [place]. [place], [place], [address] Phone: [phone]   Fax:  [phone]  Name: Carrie Shelton MRN: [id number] Date of Birth: 1971/04/20

## 2020-12-05 ENCOUNTER — Other Ambulatory Visit: Payer: Self-pay

## 2020-12-05 ENCOUNTER — Ambulatory Visit: Payer: 59 | Admitting: Physical Therapy

## 2020-12-05 ENCOUNTER — Encounter: Payer: Self-pay | Admitting: Physical Therapy

## 2020-12-05 DIAGNOSIS — M5441 Lumbago with sciatica, right side: Secondary | ICD-10-CM

## 2020-12-05 DIAGNOSIS — R252 Cramp and spasm: Secondary | ICD-10-CM

## 2020-12-05 DIAGNOSIS — M6281 Muscle weakness (generalized): Secondary | ICD-10-CM

## 2020-12-05 DIAGNOSIS — G8929 Other chronic pain: Secondary | ICD-10-CM

## 2020-12-05 NOTE — Therapy (Signed)
[place]. [place], [place], [address] Phone: [phone]   Fax:  [phone]  Physical Therapy Treatment  Patient Details  Name: Carrie Shelton MRN: [id number] Date of Birth: Feb 13, 1971 Referring Provider (PT): Posey Pronto   Encounter Date: 12/05/2020   PT End of Session - 12/05/20 1013    Visit Number 3    Date for PT Re-Evaluation 01/27/21    PT Start Time 0936    PT Stop Time 1015    PT Time Calculation (min) 39 min    Activity Tolerance Patient tolerated treatment well    Behavior During Therapy [facility] for tasks assessed/performed           Past Medical History:  Diagnosis Date  . Anemia   . Vertigo     Past Surgical History:  Procedure Laterality Date  . CHOLECYSTECTOMY N/A 08/01/2014   Procedure: LAPAROSCOPIC CHOLECYSTECTOMY WITH INTRAOPERATIVE CHOLANGIOGRAM;  Surgeon: Erroll Luna, MD;  Location: [place];  Service: General;  Laterality: N/A;  . DILATION AND CURETTAGE OF UTERUS     daughter is not really sure this is the only word she could remember    There were no vitals filed for this visit.   Subjective Assessment - 12/05/20 0937    Subjective 6 min late, pain comes and goes, some pain last night ~ 2am    Patient is accompained by: Family member    Limitations Lifting;Standing;Walking    Currently in Pain? Yes    Pain Score 1     Pain Location Back    Pain Orientation Right;Lower                             [facility] Adult PT Treatment/Exercise - 12/05/20 0001      Lumbar Exercises: Stretches   Passive Hamstring Stretch Right;5 reps;20 seconds;10 seconds    Single Knee to Chest Stretch Right;4 reps;10 seconds    ITB Stretch Right;3 reps;10 seconds    Piriformis Stretch Left;3 reps;10 seconds      Lumbar Exercises: Aerobic   UBE (Upper Arm Bike) L1 x2 min each      Lumbar Exercises: Machines for Strengthening   Cybex Knee Extension 5lb 2x10    Cybex Knee Flexion  20lb x10      Lumbar Exercises: Standing   Row Theraband;20 reps;Both    Theraband Level (Row) Level 3 (Green)      Lumbar Exercises: Seated   Sit to Stand 20 reps   Holding yellow ball                   PT Short Term Goals - 11/27/20 1747      PT SHORT TERM GOAL #1   Title Pt will be I with initial HEP    Time 2    Period Weeks    Status New    Target Date 12/11/20             PT Long Term Goals - 11/27/20 1748      PT LONG TERM GOAL #1   Title Pt will be I with advanced HEP    Time 8    Period Weeks    Status New    Target Date 01/22/21      PT LONG TERM GOAL #2   Title Pt will report resolution of RLE radiating pain    Time 6    Period Weeks  Status New    Target Date 01/09/21      PT LONG TERM GOAL #3   Title Pt will demo R SLR equivalent to LLE    Time 6    Period Weeks    Status New    Target Date 01/09/21      PT LONG TERM GOAL #4   Title Pt will report 50% reduction in LBP    Time 6    Period Weeks    Status New    Target Date 01/09/21                 Plan - 12/05/20 1013    Clinical Impression Statement Daughter present to translate. Pt able to complete interventions. Postural cues needed for row and ext extensions to prevent postural swaying. Cues not to allow LE to push against table  with sit to stands. Seated HS curls did cause some posterior RLE pain opting to only do one set. Some trouble noted transferring from sitting to supine. Positive response to RLE stretching.    Examination-Participation Restrictions Occupation;Community Activity;Interpersonal Relationship    Stability/Clinical Decision Making Evolving/Moderate complexity    Rehab Potential Good    PT Frequency 2x / week    PT Duration 6 weeks    PT Treatment/Interventions ADLs/Self Care Home Management;Electrical Stimulation;Iontophoresis 4mg /ml Dexamethasone;Moist Heat;Functional mobility training;Therapeutic activities;Therapeutic exercise;Neuromuscular  re-education;Patient/family education;Manual techniques;Passive range of motion    PT Next Visit Plan further assess lifting capacity to write informal note for job if required, follow up on pt referral for formal assessment of functional work capacity, initiate lumbar AROM/flexibility/core stab, manual to hip flexor. ISSUE HEP after further instruction           Patient will benefit from skilled therapeutic intervention in order to improve the following deficits and impairments:  Abnormal gait,Decreased range of motion,Difficulty walking,Increased muscle spasms,Decreased activity tolerance,Pain,Hypomobility,Decreased strength  Visit Diagnosis: Muscle weakness (generalized)  Chronic right-sided low back pain with right-sided sciatica  Cramp and spasm     Problem List Patient Active Problem List   Diagnosis Date Noted  . Rectal bleeding 04/03/2020  . Plantar fasciitis 01/12/2020  . Colon cancer screening 01/12/2020  . Left leg weakness 11/05/2019  . Tension headache 11/05/2019  . Sciatica 10/19/2019  . Inferior calcaneal bone spur 10/03/2019  . Fatigue 05/19/2018  . Vision changes 01/22/2018  . Seasonal allergies 01/22/2018  . Low back pain with radiation 10/08/2017  . Lipoma 09/05/2013    [name] 12/05/2020, 10:18 AM  [place]. [place], [place], [address] Phone: [phone]   Fax:  [phone]  Name: Carrie Shelton MRN: [id number] Date of Birth: [birth year]

## 2020-12-07 ENCOUNTER — Other Ambulatory Visit: Payer: Self-pay

## 2020-12-07 ENCOUNTER — Ambulatory Visit: Payer: 59 | Admitting: Physical Therapy

## 2020-12-07 ENCOUNTER — Encounter: Payer: Self-pay | Admitting: Physical Therapy

## 2020-12-07 DIAGNOSIS — G8929 Other chronic pain: Secondary | ICD-10-CM

## 2020-12-07 DIAGNOSIS — M5441 Lumbago with sciatica, right side: Secondary | ICD-10-CM | POA: Diagnosis not present

## 2020-12-07 DIAGNOSIS — M6281 Muscle weakness (generalized): Secondary | ICD-10-CM

## 2020-12-07 DIAGNOSIS — R252 Cramp and spasm: Secondary | ICD-10-CM

## 2020-12-07 NOTE — Therapy (Signed)
[place]. [place], [place], [address] Phone: [phone]   Fax:  [phone]  Physical Therapy Treatment  Patient Details  Name: Carrie Shelton MRN: [id number] Date of Birth: 09-Jun-1971 Referring Provider (PT): Posey Pronto   Encounter Date: 12/07/2020   PT End of Session - 12/07/20 1017    Visit Number 4    Date for PT Re-Evaluation 01/27/21    PT Start Time 0938    PT Stop Time 1014    PT Time Calculation (min) 36 min    Activity Tolerance Patient tolerated treatment well    Behavior During Therapy [facility] for tasks assessed/performed           Past Medical History:  Diagnosis Date  . Anemia   . Vertigo     Past Surgical History:  Procedure Laterality Date  . CHOLECYSTECTOMY N/A 08/01/2014   Procedure: LAPAROSCOPIC CHOLECYSTECTOMY WITH INTRAOPERATIVE CHOLANGIOGRAM;  Surgeon: Erroll Luna, MD;  Location: [place];  Service: General;  Laterality: N/A;  . DILATION AND CURETTAGE OF UTERUS     daughter is not really sure this is the only word she could remember    There were no vitals filed for this visit.   Subjective Assessment - 12/07/20 0940    Subjective 9 min late, doing ok, just a little pain    Currently in Pain? Yes    Pain Score 5     Pain Location Back    Pain Orientation Right                             [facility] Adult PT Treatment/Exercise - 12/07/20 0001      Lumbar Exercises: Aerobic   Nustep L3 x6 min      Lumbar Exercises: Machines for Strengthening   Other Lumbar Machine Exercise Roes and lats 15lb 2x10      Lumbar Exercises: Standing   Shoulder Extension 20 reps;Both;Strengthening    Shoulder Extension Limitations 10    Other Standing Lumbar Exercises hip ext, abd, mini squats  with HHA 10 BIL      Lumbar Exercises: Seated   Sit to Stand 20 reps                    PT Short Term Goals - 11/27/20 1747      PT SHORT TERM GOAL #1   Title Pt will be  I with initial HEP    Time 2    Period Weeks    Status New    Target Date 12/11/20             PT Long Term Goals - 11/27/20 1748      PT LONG TERM GOAL #1   Title Pt will be I with advanced HEP    Time 8    Period Weeks    Status New    Target Date 01/22/21      PT LONG TERM GOAL #2   Title Pt will report resolution of RLE radiating pain    Time 6    Period Weeks    Status New    Target Date 01/09/21      PT LONG TERM GOAL #3   Title Pt will demo R SLR equivalent to LLE    Time 6    Period Weeks    Status New    Target Date 01/09/21      PT LONG TERM  GOAL #4   Title Pt will report 50% reduction in LBP    Time 6    Period Weeks    Status New    Target Date 01/09/21                 Plan - 12/07/20 1017    Clinical Impression Statement Pt ~9 minutes late. Daughter present to translate. Pt reports that she always has some pain unless she takes something. Progressed to some machine level postural strengthening. Tactile cues provided with seated rows to prevent posterior leaning. Postural weakness present with standing shoulder extensions. R hip weakness noted with standing abduction.    Examination-Participation Restrictions Occupation;Community Activity;Interpersonal Relationship    Stability/Clinical Decision Making Evolving/Moderate complexity    Rehab Potential Good    PT Frequency 2x / week    PT Duration 6 weeks    PT Treatment/Interventions ADLs/Self Care Home Management;Electrical Stimulation;Iontophoresis 4mg /ml Dexamethasone;Moist Heat;Functional mobility training;Therapeutic activities;Therapeutic exercise;Neuromuscular re-education;Patient/family education;Manual techniques;Passive range of motion    PT Next Visit Plan further assess lifting capacity to write informal note for job if required, follow up on pt referral for formal assessment of functional work capacity, initiate lumbar AROM/flexibility/core stab, manual to hip flexor. ISSUE HEP after  further instruction           Patient will benefit from skilled therapeutic intervention in order to improve the following deficits and impairments:  Abnormal gait,Decreased range of motion,Difficulty walking,Increased muscle spasms,Decreased activity tolerance,Pain,Hypomobility,Decreased strength  Visit Diagnosis: No diagnosis found.     Problem List Patient Active Problem List   Diagnosis Date Noted  . Rectal bleeding 04/03/2020  . Plantar fasciitis 01/12/2020  . Colon cancer screening 01/12/2020  . Left leg weakness 11/05/2019  . Tension headache 11/05/2019  . Sciatica 10/19/2019  . Inferior calcaneal bone spur 10/03/2019  . Fatigue 05/19/2018  . Vision changes 01/22/2018  . Seasonal allergies 01/22/2018  . Low back pain with radiation 10/08/2017  . Lipoma 09/05/2013    [name] 12/07/2020, 10:23 AM  [place]. [place], [place], [address] Phone: [phone]   Fax:  [phone]  Name: Carrie Shelton MRN: [id number] Date of Birth: [birth year]

## 2020-12-11 ENCOUNTER — Ambulatory Visit: Payer: 59 | Admitting: Physical Therapy

## 2020-12-11 ENCOUNTER — Encounter: Payer: Self-pay | Admitting: Physical Therapy

## 2020-12-11 ENCOUNTER — Other Ambulatory Visit: Payer: Self-pay

## 2020-12-11 DIAGNOSIS — R252 Cramp and spasm: Secondary | ICD-10-CM

## 2020-12-11 DIAGNOSIS — M6281 Muscle weakness (generalized): Secondary | ICD-10-CM

## 2020-12-11 DIAGNOSIS — G8929 Other chronic pain: Secondary | ICD-10-CM

## 2020-12-11 DIAGNOSIS — M5441 Lumbago with sciatica, right side: Secondary | ICD-10-CM | POA: Diagnosis not present

## 2020-12-11 NOTE — Patient Instructions (Signed)
Access Code: [email or web address] URL: [email or web address] Date: 12/11/2020 Prepared by: Cheri Fowler  Exercises Supine Bridge - 1 x daily - 7 x weekly - 3 sets - 10 reps Standing Hip Extension at [place] - 1 x daily - 7 x weekly - 3 sets - 10 reps Shoulder Extension with Resistance - 1 x daily - 7 x weekly - 3 sets - 10 reps Standing Row with Resistance - 1 x daily - 7 x weekly - 3 sets - 10 reps

## 2020-12-11 NOTE — Therapy (Signed)
[place], [place], [address] Phone: [phone]   Fax:  [phone]  Physical Therapy Treatment  Patient Details  Name: Carrie Shelton MRN: [id number] Date of Birth: 05-19-1971 Referring Provider (PT): Posey Pronto   Encounter Date: 12/11/2020   PT End of Session - 12/11/20 2353    Visit Number 5    Date for PT Re-Evaluation 01/27/21    PT Start Time 6144    PT Stop Time 1645    PT Time Calculation (min) 31 min    Activity Tolerance Patient tolerated treatment well    Behavior During Therapy [facility] for tasks assessed/performed           Past Medical History:  Diagnosis Date  . Anemia   . Vertigo     Past Surgical History:  Procedure Laterality Date  . CHOLECYSTECTOMY N/A 08/01/2014   Procedure: LAPAROSCOPIC CHOLECYSTECTOMY WITH INTRAOPERATIVE CHOLANGIOGRAM;  Surgeon: Erroll Luna, MD;  Location: [place];  Service: General;  Laterality: N/A;  . DILATION AND CURETTAGE OF UTERUS     daughter is not really sure this is the only word she could remember    There were no vitals filed for this visit.   Subjective Assessment - 12/11/20 1615    Subjective Pain is worst in the morning 3am, takes aleve in the morning the pain leaves    Currently in Pain? No/denies                             [facility] Adult PT Treatment/Exercise - 12/11/20 0001      Lumbar Exercises: Stretches   Passive Hamstring Stretch Right;5 reps;20 seconds;10 seconds    Single Knee to Chest Stretch Right;4 reps;10 seconds    Lower Trunk Rotation 5 reps;10 seconds      Lumbar Exercises: Aerobic   Nustep L3 x6 min      Lumbar Exercises: Supine   Bridge 10 reps;2 seconds;Compliant                    PT Short Term Goals - 11/27/20 1747      PT SHORT TERM GOAL #1   Title Pt will be I with initial HEP    Time 2    Period Weeks    Status New    Target Date 12/11/20             PT Long Term  Goals - 11/27/20 1748      PT LONG TERM GOAL #1   Title Pt will be I with advanced HEP    Time 8    Period Weeks    Status New    Target Date 01/22/21      PT LONG TERM GOAL #2   Title Pt will report resolution of RLE radiating pain    Time 6    Period Weeks    Status New    Target Date 01/09/21      PT LONG TERM GOAL #3   Title Pt will demo R SLR equivalent to LLE    Time 6    Period Weeks    Status New    Target Date 01/09/21      PT LONG TERM GOAL #4   Title Pt will report 50% reduction in LBP    Time 6    Period Weeks    Status New    Target Date 01/09/21  Plan - 12/11/20 1647    Clinical Impression Statement Pt ~ 14 minutes late reporting that th pain was worst in the morning. Pt reports that she has been sleeping on the floor, couch, and but having pain regardless. Pt reports sleeping on her L side but having pain on the R side. Advised pt to sleep with a pillow between the knees to see if it helps. Pt very slow transferring from sitting to supine. Hip bridges causes pain initially. Stretching pain reported with R hip piriformis stretch. Updated HEP told pt not to complete hip bridges if sh is in pain.    Examination-Participation Restrictions Occupation;Community Activity;Interpersonal Relationship    Stability/Clinical Decision Making Evolving/Moderate complexity    Rehab Potential Good    PT Frequency 2x / week    PT Duration 6 weeks    PT Treatment/Interventions ADLs/Self Care Home Management;Electrical Stimulation;Iontophoresis 4mg /ml Dexamethasone;Moist Heat;Functional mobility training;Therapeutic activities;Therapeutic exercise;Neuromuscular re-education;Patient/family education;Manual techniques;Passive range of motion    PT Next Visit Plan asses Tx           Patient will benefit from skilled therapeutic intervention in order to improve the following deficits and impairments:  Abnormal gait,Decreased range of motion,Difficulty  walking,Increased muscle spasms,Decreased activity tolerance,Pain,Hypomobility,Decreased strength  Visit Diagnosis: Cramp and spasm  Chronic right-sided low back pain with right-sided sciatica  Muscle weakness (generalized)     Problem List Patient Active Problem List   Diagnosis Date Noted  . Rectal bleeding 04/03/2020  . Plantar fasciitis 01/12/2020  . Colon cancer screening 01/12/2020  . Left leg weakness 11/05/2019  . Tension headache 11/05/2019  . Sciatica 10/19/2019  . Inferior calcaneal bone spur 10/03/2019  . Fatigue 05/19/2018  . Vision changes 01/22/2018  . Seasonal allergies 01/22/2018  . Low back pain with radiation 10/08/2017  . Lipoma 09/05/2013    [name], PTA 12/11/2020, 4:51 PM  [place] [place], [place], [address] Phone: [phone]   Fax:  [phone]  Name: Carrie Shelton MRN: [id number] Date of Birth: 12-13-1970

## 2020-12-13 ENCOUNTER — Other Ambulatory Visit: Payer: Self-pay

## 2020-12-13 ENCOUNTER — Ambulatory Visit: Payer: 59 | Admitting: Physical Therapy

## 2020-12-13 ENCOUNTER — Encounter: Payer: Self-pay | Admitting: Physical Therapy

## 2020-12-13 DIAGNOSIS — G8929 Other chronic pain: Secondary | ICD-10-CM

## 2020-12-13 DIAGNOSIS — R252 Cramp and spasm: Secondary | ICD-10-CM

## 2020-12-13 DIAGNOSIS — M5441 Lumbago with sciatica, right side: Secondary | ICD-10-CM | POA: Diagnosis not present

## 2020-12-13 DIAGNOSIS — M6281 Muscle weakness (generalized): Secondary | ICD-10-CM

## 2020-12-13 NOTE — Therapy (Signed)
[place] [place], [place], [address] Phone: [phone]   Fax:  [phone]  Physical Therapy Treatment  Patient Details  Name: Carrie Shelton MRN: [id number] Date of Birth: 05-23-1971 Referring Provider (PT): Lavell Islam Date: 12/13/2020   PT End of Session - 12/13/20 1644    Visit Number 6    Date for PT Re-Evaluation 01/27/21    PT Start Time 1602    PT Stop Time 1644    PT Time Calculation (min) 42 min    Activity Tolerance Patient tolerated treatment well    Behavior During Therapy [facility] for tasks assessed/performed           Past Medical History:  Diagnosis Date  . Anemia   . Vertigo     Past Surgical History:  Procedure Laterality Date  . CHOLECYSTECTOMY N/A 08/01/2014   Procedure: LAPAROSCOPIC CHOLECYSTECTOMY WITH INTRAOPERATIVE CHOLANGIOGRAM;  Surgeon: Erroll Luna, MD;  Location: [place];  Service: General;  Laterality: N/A;  . DILATION AND CURETTAGE OF UTERUS     daughter is not really sure this is the only word she could remember    There were no vitals filed for this visit.   Subjective Assessment - 12/13/20 1602    Subjective "Good" Today no pain, yesterday 1 am to 4 am she had pain    Diagnostic tests MRI lumbar spine 2021    Currently in Pain? No/denies                             [facility] Adult PT Treatment/Exercise - 12/13/20 0001      Lumbar Exercises: Aerobic   Nustep L4 x6 min      Lumbar Exercises: Machines for Strengthening   Cybex Lumbar Extension black band 2x10    Other Lumbar Machine Exercise Roes and lats 20lb 2x10      Lumbar Exercises: Standing   Other Standing Lumbar Exercises 4in step ups x 10 each, Hip abd, flex, marches x10 each    Other Standing Lumbar Exercises Resisted side step 20lb x 5 each                    PT Short Term Goals - 11/27/20 1747      PT SHORT TERM GOAL #1   Title Pt will be I with initial HEP     Time 2    Period Weeks    Status New    Target Date 12/11/20             PT Long Term Goals - 11/27/20 1748      PT LONG TERM GOAL #1   Title Pt will be I with advanced HEP    Time 8    Period Weeks    Status New    Target Date 01/22/21      PT LONG TERM GOAL #2   Title Pt will report resolution of RLE radiating pain    Time 6    Period Weeks    Status New    Target Date 01/09/21      PT LONG TERM GOAL #3   Title Pt will demo R SLR equivalent to LLE    Time 6    Period Weeks    Status New    Target Date 01/09/21      PT LONG TERM GOAL #4   Title Pt will report 50% reduction  in LBP    Time 6    Period Weeks    Status New    Target Date 01/09/21                 Plan - 12/13/20 1644    Clinical Impression Statement Pt did well today completing the interventions. Report some Low back pain on R side with step ups using RLE and with R hip flexion. Tactile cues for posture needed with seated rows and lats. Some instability noted during the eccentric phase of side step with resistance on L side.    Examination-Participation Restrictions Occupation;Community Activity;Interpersonal Relationship    Stability/Clinical Decision Making Evolving/Moderate complexity    Rehab Potential Good    PT Frequency 2x / week    PT Duration 6 weeks    PT Treatment/Interventions ADLs/Self Care Home Management;Electrical Stimulation;Iontophoresis 4mg /ml Dexamethasone;Moist Heat;Functional mobility training;Therapeutic activities;Therapeutic exercise;Neuromuscular re-education;Patient/family education;Manual techniques;Passive range of motion    PT Next Visit Plan asses Tx progress as tolerated    PT Home Exercise Plan LTR, hamstring stretch, hip flexor stretch, STS           Patient will benefit from skilled therapeutic intervention in order to improve the following deficits and impairments:  Abnormal gait,Decreased range of motion,Difficulty walking,Increased muscle  spasms,Decreased activity tolerance,Pain,Hypomobility,Decreased strength  Visit Diagnosis: Chronic right-sided low back pain with right-sided sciatica  Muscle weakness (generalized)  Cramp and spasm     Problem List Patient Active Problem List   Diagnosis Date Noted  . Rectal bleeding 04/03/2020  . Plantar fasciitis 01/12/2020  . Colon cancer screening 01/12/2020  . Left leg weakness 11/05/2019  . Tension headache 11/05/2019  . Sciatica 10/19/2019  . Inferior calcaneal bone spur 10/03/2019  . Fatigue 05/19/2018  . Vision changes 01/22/2018  . Seasonal allergies 01/22/2018  . Low back pain with radiation 10/08/2017  . Lipoma 09/05/2013    [name] 12/13/2020, 4:47 PM  [place] [place], [place], [address] Phone: [phone]   Fax:  [phone]  Name: Carrie Shelton MRN: [id number] Date of Birth: 12/19/1970

## 2020-12-24 ENCOUNTER — Ambulatory Visit: Payer: 59 | Attending: Family Medicine | Admitting: Physical Therapy

## 2020-12-24 DIAGNOSIS — R252 Cramp and spasm: Secondary | ICD-10-CM | POA: Insufficient documentation

## 2020-12-24 DIAGNOSIS — M6281 Muscle weakness (generalized): Secondary | ICD-10-CM | POA: Insufficient documentation

## 2020-12-24 DIAGNOSIS — G8929 Other chronic pain: Secondary | ICD-10-CM | POA: Insufficient documentation

## 2020-12-24 DIAGNOSIS — M5441 Lumbago with sciatica, right side: Secondary | ICD-10-CM | POA: Insufficient documentation

## 2020-12-26 ENCOUNTER — Encounter: Payer: Self-pay | Admitting: Nurse Practitioner

## 2020-12-27 ENCOUNTER — Ambulatory Visit: Payer: 59 | Admitting: Physical Therapy

## 2020-12-27 ENCOUNTER — Encounter: Payer: Self-pay | Admitting: Physical Therapy

## 2020-12-27 ENCOUNTER — Other Ambulatory Visit: Payer: Self-pay

## 2020-12-27 DIAGNOSIS — R252 Cramp and spasm: Secondary | ICD-10-CM

## 2020-12-27 DIAGNOSIS — M5441 Lumbago with sciatica, right side: Secondary | ICD-10-CM

## 2020-12-27 DIAGNOSIS — G8929 Other chronic pain: Secondary | ICD-10-CM | POA: Diagnosis present

## 2020-12-27 DIAGNOSIS — M6281 Muscle weakness (generalized): Secondary | ICD-10-CM

## 2020-12-27 NOTE — Therapy (Signed)
[place] [place], [place], [address] Phone: [phone]   Fax:  [phone]  Physical Therapy Treatment  Patient Details  Name: Carrie Shelton MRN: [id number] Date of Birth: 1971-07-18 Referring Provider (PT): Posey Pronto   Encounter Date: 12/27/2020   PT End of Session - 12/27/20 1658    Visit Number 7    Date for PT Re-Evaluation 01/27/21    PT Start Time 3419    PT Stop Time 1658    PT Time Calculation (min) 43 min    Activity Tolerance Patient tolerated treatment well    Behavior During Therapy [facility] for tasks assessed/performed           Past Medical History:  Diagnosis Date  . Anemia   . Vertigo     Past Surgical History:  Procedure Laterality Date  . CHOLECYSTECTOMY N/A 08/01/2014   Procedure: LAPAROSCOPIC CHOLECYSTECTOMY WITH INTRAOPERATIVE CHOLANGIOGRAM;  Surgeon: Erroll Luna, MD;  Location: [place];  Service: General;  Laterality: N/A;  . DILATION AND CURETTAGE OF UTERUS     daughter is not really sure this is the only word she could remember    There were no vitals filed for this visit.   Subjective Assessment - 12/27/20 1619    Subjective Pt reports that she has not been doing HEP; states that pain remains largely unchanged from time of eval    Currently in Pain? Yes    Pain Score 3     Pain Location Back              [facility] PT Assessment - 12/27/20 0001      AROM   Overall AROM Comments lumbar AROM limited 25% + no pain                         [facility] Adult PT Treatment/Exercise - 12/27/20 0001      Self-Care   Self-Care Other Self-Care Comments    Other Self-Care Comments  education on additional HEP ex's to maintain LE strength      Lumbar Exercises: Aerobic   Nustep L5 x 6 min      Lumbar Exercises: Machines for Strengthening   Cybex Lumbar Extension black band 2x10    Other Lumbar Machine Exercise rows and lats 20# 2x10      Lumbar Exercises: Standing    Heel Raises 15 reps    Other Standing Lumbar Exercises hip abd/ext/flex x10 B      Lumbar Exercises: Seated   Sit to Stand 20 reps   2x10 with 2# chest press and OHP   Other Seated Lumbar Exercises seated iso abs x15 3 sec hold                    PT Short Term Goals - 11/27/20 1747      PT SHORT TERM GOAL #1   Title Pt will be I with initial HEP    Time 2    Period Weeks    Status New    Target Date 12/11/20             PT Long Term Goals - 12/27/20 1620      PT LONG TERM GOAL #1   Title Pt will be I with advanced HEP    Time 8    Period Weeks    Status On-going      PT LONG TERM GOAL #2   Title Pt  will report resolution of RLE radiating pain    Time 6    Period Weeks    Status On-going      PT LONG TERM GOAL #3   Title Pt will demo R SLR equivalent to LLE    Time 6    Period Weeks    Status New      PT LONG TERM GOAL #4   Title Pt will report 50% reduction in LBP    Baseline no change in LBP    Time 6    Period Weeks    Status On-going                 Plan - 12/27/20 1658    Clinical Impression Statement Pt demos improvements in lumbar AROM with no pain. Pt tolerated progression of TE well. Mild pain/discomfort in RLE with standing hip ex's. Cuing to avoid compensations with hip ex's. Tactile/verbal cues for seated rows/lats. Pt has gap in tx for 2 weeks d/t scheduling difficulties; prescribed additional ex's for HEP to maintain strength gains and educated pt and daughter on importance of exercising at home with VU and agreement.    PT Treatment/Interventions ADLs/Self Care Home Management;Electrical Stimulation;Iontophoresis 4mg /ml Dexamethasone;Moist Heat;Functional mobility training;Therapeutic activities;Therapeutic exercise;Neuromuscular re-education;Patient/family education;Manual techniques;Passive range of motion    PT Next Visit Plan asses Tx progress as tolerated    Consulted and Agree with Plan of Care Patient            Patient will benefit from skilled therapeutic intervention in order to improve the following deficits and impairments:  Abnormal gait,Decreased range of motion,Difficulty walking,Increased muscle spasms,Decreased activity tolerance,Pain,Hypomobility,Decreased strength  Visit Diagnosis: Chronic right-sided low back pain with right-sided sciatica  Muscle weakness (generalized)  Cramp and spasm     Problem List Patient Active Problem List   Diagnosis Date Noted  . Rectal bleeding 04/03/2020  . Plantar fasciitis 01/12/2020  . Colon cancer screening 01/12/2020  . Left leg weakness 11/05/2019  . Tension headache 11/05/2019  . Sciatica 10/19/2019  . Inferior calcaneal bone spur 10/03/2019  . Fatigue 05/19/2018  . Vision changes 01/22/2018  . Seasonal allergies 01/22/2018  . Low back pain with radiation 10/08/2017  . Lipoma 09/05/2013   Amador Cunas, PT, DPT Donald Prose Carrie Shelton 12/27/2020, 5:10 PM  [place]. [place], [place], [address] Phone: [phone]   Fax:  [phone]  Name: Carrie Shelton MRN: [id number] Date of Birth: 09/19/1971

## 2021-01-07 ENCOUNTER — Ambulatory Visit: Payer: 59 | Admitting: Physical Therapy

## 2021-01-09 ENCOUNTER — Ambulatory Visit: Payer: 59 | Admitting: Physical Therapy

## 2021-01-11 ENCOUNTER — Ambulatory Visit: Payer: 59 | Admitting: Nurse Practitioner

## 2021-02-11 ENCOUNTER — Ambulatory Visit: Payer: 59 | Attending: Family Medicine | Admitting: Physical Therapy

## 2021-08-30 ENCOUNTER — Other Ambulatory Visit: Payer: Self-pay

## 2021-08-30 ENCOUNTER — Encounter: Payer: Self-pay | Admitting: Family Medicine

## 2021-08-30 ENCOUNTER — Ambulatory Visit (INDEPENDENT_AMBULATORY_CARE_PROVIDER_SITE_OTHER): Payer: 59 | Admitting: Family Medicine

## 2021-08-30 ENCOUNTER — Ambulatory Visit (INDEPENDENT_AMBULATORY_CARE_PROVIDER_SITE_OTHER): Payer: 59

## 2021-08-30 VITALS — BP 102/70 | HR 87 | Ht 60.0 in | Wt 179.0 lb

## 2021-08-30 DIAGNOSIS — M5432 Sciatica, left side: Secondary | ICD-10-CM | POA: Diagnosis not present

## 2021-08-30 DIAGNOSIS — Z23 Encounter for immunization: Secondary | ICD-10-CM

## 2021-08-30 NOTE — Patient Instructions (Addendum)
kan min alraayie ruyatuk alyawma! naqashna alyawm alam alzuhr alati 'aetaqid 'anaha natijat ean alam aleasab alwarki almawjudat fi alsaaqi. altamadud hu aleilaj alrayiysiu lidhalika. yurjaa alta'akud min alqiam bidhalik maratayn ealaa al'aqali ywmyan lilmusaeadat fi takhfif al'alam wamune al'alam min albad' maratan 'ukhraa. yurjaa almutabaeat fi maweidik almujadwal altaali khilal shahr wahid mae muafir alrieayat alrayiysiat [place] bik , 'iidha hadath 'ayu shay' bayn alhin walakhir , falraja' eadam altaradud fi alaitisal bimaktabina. nashkur[place] ealaa alsamah lana bi'an nakun jz'an min rieayatik altibiyiti! shkran lika, duktur ghanta   It was great seeing you today!  Today we discussed your back pain which I believe is due to sciatic nerve pain that is present in the leg. Stretches are the main treatment for this. Please make sure to do these at least twice daily to help relieve the pain and prevent the pain from starting again.   Please follow up at your next scheduled appointment in 1 month with your PCP, if anything arises between now and then, please don't hesitate to contact our office.   Thank you for allowing [place] to be a part of your medical care!  Thank you, Dr. Larae Grooms

## 2021-08-30 NOTE — Assessment & Plan Note (Addendum)
-  pain appears chronic in nature, no red flag symptoms, does not seem consistent with neuropathy, possibly degenerative changes contributing to existing sciatica pain -handout of stretching exercises provided -continue current regimen -follow up in 1 month, consider PT referral if pain persists

## 2021-08-30 NOTE — Progress Notes (Signed)
    SUBJECTIVE:   CHIEF COMPLAINT / HPI:   Patient with history of back pain presents for visit, she is accompanied by daughter. Endorsing left lower back pain that she has had for almost year but has worsened over the past week. Aching and stabbing pain that radiates down her left leg. Last episode was last night but feeling much better today. Feels that the pain has improved after ibuprofen and over the counter leg and pain cream that helps. When she is in pain, getting around is hard but otherwise has no issues. Has seen physical therapy in the past which helped a little bit but the pain came back. Denies any recent trauma or injury that may have contributed to initial worsening pain other than a panic attack that she had at work a few weeks ago. Denies any numbness or tingling associated with the pain. Denies any history of cancer and denies groin paresthesia.   OBJECTIVE:   BP 102/70   Pulse 87   Ht 5' (1.524 m)   Wt 179 lb (81.2 kg)   SpO2 98%   BMI 34.96 kg/m   General: Patient well-appearing, in no acute distress. CV: RRR Resp: CTAB MSK: normal ROM along knee joint and hips bilaterally, positive left straight leg raise , mild tenderness along L4-L5 with tightness associated with chronic changes, no rashes or lesions noted, no erythema or ecchymosis noted  Neuro: normal gait  Psych: mood appropriate   ASSESSMENT/PLAN:   Sciatica -pain appears chronic in nature, no red flag symptoms, does not seem consistent with neuropathy, possibly degenerative changes contributing to existing sciatica pain -handout of stretching exercises provided -continue current regimen -follow up in 1 month, consider PT referral if pain persists    -PHQ-9 score of 2 with negative question 9.   Daughter assisted with Arabic interpretation per patient request.   Donney Dice, [place]

## 2021-09-05 ENCOUNTER — Ambulatory Visit: Payer: 59

## 2021-12-27 ENCOUNTER — Other Ambulatory Visit: Payer: Self-pay | Admitting: Family Medicine

## 2021-12-27 DIAGNOSIS — M79621 Pain in right upper arm: Secondary | ICD-10-CM

## 2022-01-24 ENCOUNTER — Other Ambulatory Visit: Payer: Self-pay | Admitting: Family Medicine

## 2022-01-24 DIAGNOSIS — M79621 Pain in right upper arm: Secondary | ICD-10-CM

## 2022-01-27 ENCOUNTER — Ambulatory Visit
Admission: RE | Admit: 2022-01-27 | Discharge: 2022-01-27 | Disposition: A | Discharge status: Home / Self Care | Payer: Self-pay | Source: Ambulatory Visit | Attending: Family Medicine | Admitting: Family Medicine

## 2022-01-27 ENCOUNTER — Other Ambulatory Visit: Payer: Self-pay | Admitting: Family Medicine

## 2022-01-27 ENCOUNTER — Ambulatory Visit
Admission: RE | Admit: 2022-01-27 | Discharge: 2022-01-27 | Disposition: A | Discharge status: Home / Self Care | Payer: Commercial Managed Care - HMO | Source: Ambulatory Visit | Attending: Family Medicine | Admitting: Family Medicine

## 2022-01-27 DIAGNOSIS — M79621 Pain in right upper arm: Secondary | ICD-10-CM
